# Patient Record
Sex: Female | Born: 1935 | Race: White | Hispanic: No | Marital: Married | Smoking: Never smoker
Health system: Southern US, Community
[De-identification: ages and names within clinical notes are randomized; demographics above are authoritative.]

## PROBLEM LIST (undated history)

## (undated) DIAGNOSIS — L719 Rosacea, unspecified: Secondary | ICD-10-CM

## (undated) DIAGNOSIS — M542 Cervicalgia: Secondary | ICD-10-CM

## (undated) DIAGNOSIS — K449 Diaphragmatic hernia without obstruction or gangrene: Secondary | ICD-10-CM

## (undated) DIAGNOSIS — M810 Age-related osteoporosis without current pathological fracture: Secondary | ICD-10-CM

## (undated) DIAGNOSIS — N6019 Diffuse cystic mastopathy of unspecified breast: Secondary | ICD-10-CM

## (undated) DIAGNOSIS — I341 Nonrheumatic mitral (valve) prolapse: Secondary | ICD-10-CM

## (undated) DIAGNOSIS — J309 Allergic rhinitis, unspecified: Secondary | ICD-10-CM

## (undated) DIAGNOSIS — N200 Calculus of kidney: Secondary | ICD-10-CM

## (undated) DIAGNOSIS — E559 Vitamin D deficiency, unspecified: Secondary | ICD-10-CM

## (undated) DIAGNOSIS — E213 Hyperparathyroidism, unspecified: Secondary | ICD-10-CM

## (undated) DIAGNOSIS — M1990 Unspecified osteoarthritis, unspecified site: Secondary | ICD-10-CM

## (undated) DIAGNOSIS — C3490 Malignant neoplasm of unspecified part of unspecified bronchus or lung: Secondary | ICD-10-CM

## (undated) DIAGNOSIS — R002 Palpitations: Secondary | ICD-10-CM

## (undated) DIAGNOSIS — K224 Dyskinesia of esophagus: Secondary | ICD-10-CM

## (undated) HISTORY — PX: ABDOMINAL HYSTERECTOMY: SHX81

## (undated) HISTORY — DX: Palpitations: R00.2

## (undated) HISTORY — DX: Diaphragmatic hernia without obstruction or gangrene: K44.9

## (undated) HISTORY — PX: OTHER SURGICAL HISTORY: SHX169

## (undated) HISTORY — DX: Calculus of kidney: N20.0

## (undated) HISTORY — PX: OOPHORECTOMY: SHX86

## (undated) HISTORY — DX: Allergic rhinitis, unspecified: J30.9

## (undated) HISTORY — DX: Hypercalcemia: E83.52

## (undated) HISTORY — DX: Vitamin D deficiency, unspecified: E55.9

## (undated) HISTORY — DX: Cervicalgia: M54.2

## (undated) HISTORY — DX: Age-related osteoporosis without current pathological fracture: M81.0

## (undated) HISTORY — DX: Rosacea, unspecified: L71.9

## (undated) HISTORY — DX: Diffuse cystic mastopathy of unspecified breast: N60.19

## (undated) HISTORY — DX: Hyperparathyroidism, unspecified: E21.3

## (undated) HISTORY — DX: Nonrheumatic mitral (valve) prolapse: I34.1

## (undated) HISTORY — DX: Dyskinesia of esophagus: K22.4

## (undated) HISTORY — PX: CHOLECYSTECTOMY: SHX55

## (undated) HISTORY — DX: Unspecified osteoarthritis, unspecified site: M19.90

---

## 2004-07-21 ENCOUNTER — Other Ambulatory Visit: Payer: Self-pay

## 2004-07-21 ENCOUNTER — Emergency Department: Payer: Self-pay | Admitting: Emergency Medicine

## 2006-06-15 ENCOUNTER — Ambulatory Visit: Payer: Self-pay | Admitting: Internal Medicine

## 2007-12-20 ENCOUNTER — Ambulatory Visit: Payer: Self-pay | Admitting: Internal Medicine

## 2012-07-03 ENCOUNTER — Ambulatory Visit: Payer: Self-pay

## 2012-07-11 ENCOUNTER — Ambulatory Visit: Payer: Self-pay | Admitting: Surgery

## 2012-07-17 ENCOUNTER — Ambulatory Visit: Payer: Self-pay | Admitting: Surgery

## 2012-12-19 ENCOUNTER — Ambulatory Visit: Payer: Self-pay | Admitting: Internal Medicine

## 2013-01-01 ENCOUNTER — Ambulatory Visit: Payer: Self-pay | Admitting: Unknown Physician Specialty

## 2013-01-04 ENCOUNTER — Ambulatory Visit: Payer: Self-pay | Admitting: Specialist

## 2013-01-18 ENCOUNTER — Ambulatory Visit: Payer: Self-pay | Admitting: Cardiothoracic Surgery

## 2013-01-25 LAB — CBC CANCER CENTER
Basophil #: 0 "x10 3/mm "
Basophil %: 0.4 %
Eosinophil #: 0 "x10 3/mm "
Eosinophil %: 0.5 %
HCT: 43 %
HGB: 14.5 g/dL
Lymphocyte %: 20.8 %
Lymphs Abs: 1.8 "x10 3/mm "
MCH: 32.5 pg
MCHC: 33.7 g/dL
MCV: 96 fL
Monocyte #: 0.6 "x10 3/mm "
Monocyte %: 6.4 %
Neutrophil #: 6.2 "x10 3/mm "
Neutrophil %: 71.9 %
Platelet: 262 "x10 3/mm "
RBC: 4.46 "x10 6/mm "
RDW: 12.8 %
WBC: 8.6 "x10 3/mm "

## 2013-01-25 LAB — PROTIME-INR
INR: 0.9
Prothrombin Time: 12.7 s

## 2013-01-25 LAB — COMPREHENSIVE METABOLIC PANEL WITH GFR
Albumin: 4.1 g/dL
Alkaline Phosphatase: 90 U/L
Anion Gap: 7
BUN: 14 mg/dL
Bilirubin,Total: 0.5 mg/dL
Calcium, Total: 11 mg/dL — ABNORMAL HIGH
Chloride: 105 mmol/L
Co2: 31 mmol/L
Creatinine: 0.76 mg/dL
EGFR (African American): >60
EGFR (Non-African Amer.): >60
Glucose: 99 mg/dL
Osmolality: 285
Potassium: 4 mmol/L
SGOT(AST): 22 U/L
SGPT (ALT): 34 U/L
Sodium: 143 mmol/L
Total Protein: 7.4 g/dL

## 2013-01-25 LAB — APTT: Activated PTT: 29.3 secs (ref 23.6–35.9)

## 2013-02-12 ENCOUNTER — Ambulatory Visit: Payer: Self-pay

## 2013-02-14 ENCOUNTER — Ambulatory Visit: Payer: Self-pay | Admitting: Cardiothoracic Surgery

## 2013-02-14 ENCOUNTER — Inpatient Hospital Stay: Payer: Self-pay | Admitting: Cardiothoracic Surgery

## 2013-02-14 DIAGNOSIS — C3490 Malignant neoplasm of unspecified part of unspecified bronchus or lung: Secondary | ICD-10-CM

## 2013-02-14 HISTORY — DX: Malignant neoplasm of unspecified part of unspecified bronchus or lung: C34.90

## 2013-02-15 LAB — CBC WITH DIFFERENTIAL/PLATELET
Basophil %: 0.2 %
HGB: 12.9 g/dL (ref 12.0–16.0)
Lymphocyte %: 14.5 %
MCHC: 35 g/dL (ref 32.0–36.0)
MCV: 95 fL (ref 80–100)
Monocyte %: 9.4 %
Neutrophil #: 8.1 10*3/uL — ABNORMAL HIGH (ref 1.4–6.5)
Neutrophil %: 75.9 %
Platelet: 205 10*3/uL (ref 150–440)
RBC: 3.86 10*6/uL (ref 3.80–5.20)
WBC: 10.7 10*3/uL (ref 3.6–11.0)

## 2013-02-15 LAB — BASIC METABOLIC PANEL
BUN: 11 mg/dL (ref 7–18)
Calcium, Total: 9.3 mg/dL (ref 8.5–10.1)
Creatinine: 0.78 mg/dL (ref 0.60–1.30)
EGFR (African American): >60
Glucose: 136 mg/dL — ABNORMAL HIGH (ref 65–99)
Osmolality: 275 (ref 275–301)
Potassium: 3.6 mmol/L (ref 3.5–5.1)

## 2013-02-16 LAB — COMPREHENSIVE METABOLIC PANEL
Alkaline Phosphatase: 70 U/L (ref 50–136)
Bilirubin,Total: 0.3 mg/dL (ref 0.2–1.0)
Calcium, Total: 9.5 mg/dL (ref 8.5–10.1)
Co2: 29 mmol/L (ref 21–32)
EGFR (African American): >60
EGFR (Non-African Amer.): >60
Osmolality: 277 (ref 275–301)
SGOT(AST): 36 U/L (ref 15–37)
SGPT (ALT): 34 U/L (ref 12–78)
Sodium: 138 mmol/L (ref 136–145)

## 2013-02-16 LAB — CBC WITH DIFFERENTIAL/PLATELET
Basophil #: 0 10*3/uL (ref 0.0–0.1)
Basophil %: 0.1 %
Eosinophil #: 0 10*3/uL (ref 0.0–0.7)
HCT: 38 % (ref 35.0–47.0)
HGB: 13.1 g/dL (ref 12.0–16.0)
Lymphocyte #: 0.6 10*3/uL — ABNORMAL LOW (ref 1.0–3.6)
Lymphocyte %: 5.6 %
MCHC: 34.4 g/dL (ref 32.0–36.0)
MCV: 96 fL (ref 80–100)
Monocyte %: 7.1 %
Neutrophil #: 9.6 10*3/uL — ABNORMAL HIGH (ref 1.4–6.5)
RDW: 12.7 % (ref 11.5–14.5)

## 2013-02-17 LAB — CBC WITH DIFFERENTIAL/PLATELET
Basophil #: 0 10*3/uL (ref 0.0–0.1)
Eosinophil %: 1 %
HCT: 35.8 % (ref 35.0–47.0)
HGB: 12.5 g/dL (ref 12.0–16.0)
Lymphocyte #: 1.9 10*3/uL (ref 1.0–3.6)
MCH: 33.4 pg (ref 26.0–34.0)
MCV: 95 fL (ref 80–100)
Monocyte #: 0.7 x10 3/mm (ref 0.2–0.9)
Platelet: 198 10*3/uL (ref 150–440)
RBC: 3.76 10*6/uL — ABNORMAL LOW (ref 3.80–5.20)
RDW: 12.6 % (ref 11.5–14.5)
WBC: 8.8 10*3/uL (ref 3.6–11.0)

## 2013-02-17 LAB — BASIC METABOLIC PANEL
Anion Gap: 4 — ABNORMAL LOW (ref 7–16)
Calcium, Total: 10.2 mg/dL — ABNORMAL HIGH (ref 8.5–10.1)
Chloride: 106 mmol/L (ref 98–107)
Creatinine: 0.58 mg/dL — ABNORMAL LOW (ref 0.60–1.30)
EGFR (African American): >60
EGFR (Non-African Amer.): >60
Glucose: 99 mg/dL (ref 65–99)
Potassium: 3.9 mmol/L (ref 3.5–5.1)
Sodium: 139 mmol/L (ref 136–145)

## 2013-02-21 LAB — PATHOLOGY REPORT

## 2013-03-17 ENCOUNTER — Ambulatory Visit: Payer: Self-pay | Admitting: Cardiothoracic Surgery

## 2013-03-19 ENCOUNTER — Encounter: Payer: Self-pay | Admitting: Cardiothoracic Surgery

## 2013-04-16 ENCOUNTER — Encounter: Payer: Self-pay | Admitting: Cardiothoracic Surgery

## 2013-05-17 ENCOUNTER — Encounter: Payer: Self-pay | Admitting: Cardiothoracic Surgery

## 2013-05-31 ENCOUNTER — Ambulatory Visit: Payer: Self-pay | Admitting: Cardiothoracic Surgery

## 2013-06-17 ENCOUNTER — Ambulatory Visit: Payer: Self-pay | Admitting: Cardiothoracic Surgery

## 2013-07-15 ENCOUNTER — Ambulatory Visit: Payer: Self-pay | Admitting: Cardiothoracic Surgery

## 2013-07-15 ENCOUNTER — Ambulatory Visit: Payer: Self-pay | Admitting: Oncology

## 2013-08-15 ENCOUNTER — Ambulatory Visit: Payer: Self-pay | Admitting: Cardiothoracic Surgery

## 2013-08-15 ENCOUNTER — Ambulatory Visit: Payer: Self-pay | Admitting: Oncology

## 2014-01-15 ENCOUNTER — Ambulatory Visit: Payer: Self-pay | Admitting: Oncology

## 2014-01-17 ENCOUNTER — Ambulatory Visit: Payer: Self-pay | Admitting: Oncology

## 2014-02-14 ENCOUNTER — Ambulatory Visit: Payer: Self-pay | Admitting: Oncology

## 2014-07-18 ENCOUNTER — Ambulatory Visit: Payer: Self-pay | Admitting: Oncology

## 2014-07-23 ENCOUNTER — Ambulatory Visit
Admit: 2014-07-23 | Disposition: A | Discharge status: Home / Self Care | Payer: Self-pay | Attending: Oncology | Admitting: Oncology

## 2014-08-16 ENCOUNTER — Ambulatory Visit
Admit: 2014-08-16 | Disposition: A | Discharge status: Home / Self Care | Payer: Self-pay | Attending: Oncology | Admitting: Oncology

## 2014-08-30 ENCOUNTER — Other Ambulatory Visit: Payer: Self-pay | Admitting: Oncology

## 2014-08-30 DIAGNOSIS — D1430 Benign neoplasm of unspecified bronchus and lung: Secondary | ICD-10-CM

## 2014-09-06 NOTE — Op Note (Signed)
PATIENT NAME:  Virginia Silva, Virginia Silva MR#:  [id number] DATE OF BIRTH:  1935/09/16  DATE OF PROCEDURE:  02/14/2013  SURGEON: Nestor Lewandowsky, M.D.   ASSISTANT: Bronson Ing, Silva.   PREOPERATIVE DIAGNOSIS: Right middle lobe mass.   POSTOPERATIVE DIAGNOSIS: Right middle lobe mass.  OPERATION PERFORMED:   1.  Preoperative bronchoscopy to assess endobronchial anatomy.  2.  Right thoracotomy with right middle lobectomy.   INDICATIONS FOR PROCEDURE: Virginia Silva is a 79 year old woman, who recently was found to have a right middle lobe mass on x-ray. She was found to be a suitable candidate for surgery. She was offered the above-named procedure for definitive diagnosis and treatment.   DESCRIPTION OF PROCEDURE: The patient was brought to the operating suite and placed in the supine position. General endotracheal anesthesia was given with a double-lumen tube. Preoperative bronchoscopy was carried out. There was no evidence of endobronchial tumor. The patient was then turned for right thoracoscopy. All pressure points were carefully padded. The patient was prepped and draped in the usual sterile fashion. We began by making a single thoracoscopic port to accommodate a 15 mm trocar in the lower aspect of the chest. We then looked at the middle lobe through the camera and we could see that the tumor was in the central portion on the inferior aspect of the middle lobe, which made it quite difficult to deal with it thoracoscopically and as a wedge resection. We, therefore, extended our more anterior port to the tip of the scapula and went through the fifth interspace. Once we entered the chest, the middle lobe mass was easily palpable. It had a very fleshy consistency to it similar to what might be seen with a carcinoid tumor. It did not appear to be a hamartoma. The fissure between the upper lobe and lower lobes were created. There were 2 arterial branches and 2 venous branches to the middle lobe. We freed up the inferior  pulmonary ligament and identified the inferior pulmonary vein. We then dissected out the bronchus.   We began by dividing the arterial branches and then the venous branches. This was accomplished with a vascular stapler. We then took the middle lobe bronchus. We ventilated the upper and lower lobes before dividing the middle lobe bronchus. Once this was complete, the only remaining structure was some lung parenchyma, which was attaching the middle and the lower lobes. This was resected with the endoscopic stapler. Again, the middle and lower lobes ventilated quite nicely. We then used Progel on the cut surfaces of the lung after checking the bronchus for an air leak. No air leak was identified at 30 cm of water pressure. There was some air leaking from some of the cut surfaces of the lung parenchyma. The chest was drained with two 28-French chest tubes. A straight tube was positioned to the apex and an angled tube positioned along the paravertebral space. There were several small lymph nodes that were removed and labeled appropriately and sent for permanent sections. No other pathology was identified and the chest was then closed. Vicryl #2 pericostal sutures were used to reapproximate the ribs. The port site was closed with multiple interrupted sutures including nylon on the skin. The thoracotomy wound was closed by allowing the serratus anterior muscle to return to its normal anatomic position. It was not divided during the procedure. The latissimus muscle was closed with #2 Vicryl, the subcutaneous tissues with 2-0 Vicryl and the skin with skin clips. The patient tolerated the procedure well, was extubated  and taken to the recovery room in stable condition.  ____________________________ Virginia Dawes Virginia Bi, Silva teo:aw D: 02/14/2013 13:20:31 ET T: 02/14/2013 13:51:48 ET JOB#: [id number]  cc: Virginia Reading E. Virginia Bi, Silva, <Dictator> Virginia Silva Virginia Silva. Virginia Hutching, Silva Virginia Silva ELECTRONICALLY SIGNED  02/16/2013 8:00

## 2014-09-06 NOTE — Discharge Summary (Signed)
PATIENT NAME:  Virginia Silva, Virginia Silva MR#:  [id number] DATE OF BIRTH:  12/10/1935  DATE OF ADMISSION:  02/14/2013 DATE OF DISCHARGE:  02/19/2013  ADMITTING DIAGNOSIS: Right middle lobe mass.   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   HOSPITAL COURSE: Ms. Tersa Fotopoulos is a 79 year old woman who was recently found to have a right middle lobe nodule. She was found to be a suitable candidate for surgical resection and after discussion with the patient regarding the options she elected to proceed. She was brought in to the hospital on 02/14/2013 where she underwent a right thoracotomy and right middle lobectomy. Her postoperative course was essentially unremarkable. Her chest tubes were removed on 02/19/2013 and a chest x-ray post chest tube removal showed the lung to be fully expanded.   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 At the time of her hospital discharge her thoracotomy wound is healing as expected. She was given a prescription for Norco 5/325, 1 to 2 tablets every four hours as needed. She was also told to restart her aspirin, vitamin D and biotin. She will follow-up with me in one week. She will also see one of our oncologist for further recommendations regarding her lung tumor.    ____________________________ Lew Dawes. Genevive Bi, MD teo:sg D: 02/28/2013 13:03:40 ET T: 02/28/2013 13:32:10 ET JOB#: [id number]  cc: Christia Reading E. Genevive Bi, MD, <Dictator> Leonie Douglas. Doy Hutching, MD  Louis Matte MD ELECTRONICALLY SIGNED 02/28/2013 15:11

## 2014-09-06 NOTE — Consult Note (Signed)
[name], Virginia Silva MR#:  [id number] DATE OF BIRTH:  02-Dec-1935  DATE OF CONSULTATION:  02/14/2013  REFERRING PHYSICIAN: Nestor Lewandowsky, MD CONSULTING PHYSICIAN:  Williette Loewe A. Posey Pronto, MD  PRIMARY CARE PHYSICIAN: Fulton Reek, MD.   REASON FOR CONSULTATION: Medical management postoperative thoracotomy.   HISTORY OF PRESENT ILLNESS: Virginia Silva is a very pleasant 79 year old Caucasian female with no significant past medical history other than undergoing cholecystectomy in March of this year for acute cholecystitis. Virginia Silva was admitted by Dr. Genevive Bi to [facility] after Virginia Silva underwent an elective right thoracotomy with right middle lobectomy secondary to right middle lobe mass that was found incidentally on chest x-ray and evaluation per PET scan shows it was indeterminate for malignancy. Virginia Silva, after discussion with Dr. Genevive Bi, decided to go for surgical approach. Virginia Silva just got extubated and is hemodynamically stable. Virginia Silva sats are 96% on room air. Virginia Silva blood pressure is stabile well. Virginia Silva is tolerating ice chips at this time. Virginia Silva has some back pain secondary to positioning. Virginia Silva has Virginia IV pain medication through Virginia Silva epidural anesthesia. Virginia Silva, at present, does not have any complaints of chest pain, shortness of breath, abdominal pain, nausea or vomiting.   PAST MEDICAL HISTORY: History of mitral valve prolapse.  PAST SURGICAL HISTORY: Cholecystectomy in March 2014 for acute cholecystitis and hysterectomy in 1985.  FAMILY HISTORY: Brother died of cholangiocarcinoma. Mother deceased at age of 23 from uremia. Virginia Silva also had benign brain tumor. Father died at age 67 from heart failure.   SOCIAL HISTORY: Virginia Silva is married. 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 Virginia Silva does not drink or smoke.   HOME MEDICATIONS:  1.  Aspirin 81 mg daily.  2.  Vitamin D3 1000 international units 1 tablet b.i.d.  3.  Biotin 1000 mcg p.o. daily.   ALLERGIES: BACTRIM, TAPE AND ILLOSONE.   REVIEW OF SYSTEMS:  CONSTITUTIONAL: No fever, fatigue, weakness. EYES: No blurred or double vision, glaucoma or cataracts. ENT: No tinnitus, ear pain, hearing loss. RESPIRATORY: No cough, wheeze, hemoptysis. CARDIOVASCULAR: No chest pain, orthopnea, edema, palpitations. GASTROINTESTINAL: No nausea, vomiting, diarrhea, abdominal pain or GERD. GENITOURINARY: No dysuria or hematuria or frequency. ENDOCRINE: No polyuria, nocturia or thyroid problems. HEMATOLOGY: No anemia or easy bruising or bleeding. ENDOCRINE: No polyuria, nocturia or thyroid problems. HEMATOLOGY: No anemia or easy bruising or bleeding disorder. SKIN: No acne or rash.  MUSCULOSKELETAL: Positive for back pain. No arthritis, swelling or gout. NEUROLOGIC: No CVA, transient ischemic attack, dysarthria or ataxia or vertigo. PSYCHIATRIC: Virginia Silva has no anxiety, depression or bipolar disorder. All other systems reviewed and negative.   Virginia Silva's preop EKG done on 09/11, normal sinus rhythm. Virginia Silva PT-INR was 12.7 and 0.9. Virginia Silva comprehensive metabolic panel was within normal limits, except calcium of 11.0. CBC preop 09/11 was within normal limits.   PHYSICAL EXAMINATION:  GENERAL: Virginia Silva is awake, alert, oriented x 3, not in acute distress.  VITAL SIGNS: Virginia Silva is afebrile, pulse is 76, blood pressure 129/47, sats are 94% on room air.  HEENT: Atraumatic, normocephalic. Pupils: PERRLA. EOMI. Oral mucosa is moist.  NECK: Supple. No JVD. No carotid bruit.  LUNGS: Clear to auscultation bilaterally. No rales, rhonchi, respiratory distress or labored breathing. Virginia Silva has a right-sided chest tube placement.  CARDIOVASCULAR: Both heart sounds are normal. Rate and rhythm is regular. PMI not lateralized. Chest nontender. Good pedal pulses. Good femoral pulses. No lower extremity edema.  ABDOMEN: Soft, benign, nontender. No organomegaly. Positive bowel sounds.  NEUROLOGIC: Grossly  intact cranial nerves II through XII. No motor or sensory deficit.  PSYCHIATRIC: Virginia  Silva is awake, alert, oriented x 3.  SKIN: Warm and dry.   ASSESSMENT AND PLAN: A 79 year old, Virginia Silva with history of mitral valve prolapse, who was admitted and underwent elective right-sided thoracotomy with right middle lobe mass removal and is now admitted in [facility]. Internal medicine was consulted for:  1.  Postop medical management. Virginia Silva does not have any chronic medical problems other than history of mitral valve prolapse. Virginia Silva is hemodynamically stable, including a blood pressure and sats are more than 96%. Will resume Virginia Silva aspirin when okay with surgery. We will also resume Virginia Silva oral vitamin D3 and Biotin capsules once Virginia Silva is able to tolerate p.o.  2.  Status post right side thoracotomy with right middle lobe mass resection. Pathology results are pending. Virginia Silva is extubated postoperatively, seen in [facility]. Sats are 96% on room air. Virginia Silva is stable at this time. Virginia Silva has a right-sided chest tube present. Daily x-rays will be followed by Dr. Genevive Bi.  3.  Deep venous thrombosis prophylaxis. Virginia Silva has SCDs and TEDs.  4.  History of mitral valve prolapse, stable.  5.  Recommend incentive spirometer, which will be started soon.  6.  Further work-up according to Virginia Silva's clinical course.   Thank you for Virginia consult. We will follow Virginia Silva while [facility] Silva is in [facility].   TIME SPENT: 50 minutes.  ____________________________ Hart Rochester Posey Pronto, MD sap:aw D: 02/14/2013 15:12:41 ET T: 02/14/2013 15:26:25 ET JOB#: [id number]  cc: Jenipher Havel A. Posey Pronto, MD, <Dictator> Timothy E. Genevive Bi, MD Leonie Douglas. Doy Hutching, MD Ilda Basset MD ELECTRONICALLY SIGNED 02/14/2013 18:06

## 2014-09-06 NOTE — Op Note (Signed)
PATIENT NAME:  Virginia Silva, Virginia Silva MR#:  [id number] DATE OF BIRTH:  Sep 18, 1935  DATE OF PROCEDURE:  07/17/2012  PREOPERATIVE DIAGNOSIS: Cholecystitis, cholelithiasis.   POSTOPERATIVE DIAGNOSIS: Acute cholecystitis, cholelithiasis.   PROCEDURE: Laparoscopic cholecystectomy.   SURGEON: Rochel Brome, MD   ANESTHESIA: General.   INDICATION: This 79 year old female recently had an episode of epigastric pain, ultrasound findings of gallstones with a thickened gallbladder wall. Surgery was recommended for definitive treatment.   DESCRIPTION OF PROCEDURE: The patient was placed on the operating table in the supine position under general endotracheal anesthesia. The abdomen was prepared with ChloraPrep and draped in a sterile manner. A short incision was made in the inferior aspect of the umbilicus and carried down to the deep fascia which was grasped with laryngeal hook and elevated. A Veress needle was inserted, aspirated, and irrigated with a saline solution. Next, the peritoneal cavity was inflated with carbon dioxide. The Veress needle was removed. The 10 mm cannula was inserted. The 10 mm, 0-degree laparoscope was inserted to view the peritoneal cavity. Another incision was made in the epigastrium just to the right of midline to insert an 11 mm cannula. Two incisions were made in the lateral aspect of the right upper quadrant to introduce two 5-mm cannulas.   The gallbladder was found to be acutely distended and had a thickened wall. There were a number of adhesions which were taken down with blunt and sharp dissection and the use of hook and cautery. The gallbladder was decompressed with a lancing needle draining clear colorless bile.  The gallbladder was then retracted towards the right shoulder. Additional adhesions were taken down with blunt and sharp dissection. The neck of the gallbladder was grasped. Dissection was carried out to isolate the cystic artery from surrounding structures, and also the  cystic duct was identified and was dissected free from surrounding structures. The neck of the gallbladder was mobilized with incision of the visceral peritoneum. A critical view of safety was demonstrated. The cystic artery was divided between double Endo Clips to allow better exposure of the cystic duct. An Endo Clip was placed across the cystic duct adjacent to the neck of the gallbladder. An incision was made in the cystic duct to introduce a Reddick catheter, however, the Reddick catheter would not thread in and, therefore, a cholangiogram was not done. The Reddick catheter was removed. The cystic duct was doubly ligated with Endo Clips and divided. The gallbladder was dissected free from the liver with hook and cautery. Hemostasis was subsequently intact. The gallbladder was delivered up through the infraumbilical incision, opened and suctioned. It was necessary to enlarge the incision. There were multiple large stones. The incision was then lengthened several times, ultimately to some 3 cm in length, and I removed the gallbladder with the large stones and submitted it in formalin for routine pathology. The right upper quadrant was further inspected, irrigated, and aspirated. Hemostasis appeared to be intact. The remaining cannulas were removed. Carbon dioxide was allowed to escape from the peritoneal cavity. Next, the fascial defect at the umbilicus was closed with interrupted 0 Vicryl figure-of-eight sutures. All incisions were closed with interrupted 5-0 chromic subcuticular sutures, benzoin, and Steri-Strips. Dressings were applied with paper tape. The patient tolerated surgery satisfactorily and was then prepared for transfer to the recovery room.   ____________________________ Lenna Sciara. Rochel Brome, MD jws:cb D: 07/17/2012 10:33:07 ET T: 07/17/2012 11:14:11 ET JOB#: [id number]  cc: Loreli Dollar, MD, <Dictator> Loreli Dollar MD ELECTRONICALLY SIGNED 07/17/2012 15:21

## 2014-12-26 ENCOUNTER — Other Ambulatory Visit: Payer: Self-pay | Admitting: Internal Medicine

## 2014-12-26 DIAGNOSIS — M79605 Pain in left leg: Secondary | ICD-10-CM

## 2014-12-30 ENCOUNTER — Ambulatory Visit: Payer: Medicare Other

## 2015-01-07 ENCOUNTER — Ambulatory Visit
Admission: RE | Admit: 2015-01-07 | Discharge: 2015-01-07 | Disposition: A | Discharge status: Home / Self Care | Payer: Medicare Other | Source: Ambulatory Visit | Attending: Internal Medicine | Admitting: Internal Medicine

## 2015-01-07 DIAGNOSIS — M79605 Pain in left leg: Secondary | ICD-10-CM | POA: Diagnosis not present

## 2015-03-04 ENCOUNTER — Other Ambulatory Visit: Payer: Self-pay | Admitting: Internal Medicine

## 2015-03-04 DIAGNOSIS — E210 Primary hyperparathyroidism: Secondary | ICD-10-CM

## 2015-03-04 DIAGNOSIS — M810 Age-related osteoporosis without current pathological fracture: Secondary | ICD-10-CM

## 2015-03-10 ENCOUNTER — Ambulatory Visit: Payer: Medicare (Managed Care)

## 2015-06-30 DIAGNOSIS — E559 Vitamin D deficiency, unspecified: Secondary | ICD-10-CM | POA: Insufficient documentation

## 2015-06-30 DIAGNOSIS — E213 Hyperparathyroidism, unspecified: Secondary | ICD-10-CM | POA: Insufficient documentation

## 2015-07-09 HISTORY — PX: PARATHYROIDECTOMY: SHX19

## 2015-07-23 ENCOUNTER — Other Ambulatory Visit: Payer: Self-pay | Admitting: Oncology

## 2015-07-23 DIAGNOSIS — C7A00 Malignant carcinoid tumor of unspecified site: Secondary | ICD-10-CM

## 2015-07-24 ENCOUNTER — Ambulatory Visit
Admission: RE | Admit: 2015-07-24 | Discharge: 2015-07-24 | Disposition: A | Discharge status: Home / Self Care | Payer: Medicare Other | Source: Ambulatory Visit | Attending: Oncology | Admitting: Oncology

## 2015-07-24 ENCOUNTER — Inpatient Hospital Stay: Payer: Medicare Other | Attending: Oncology

## 2015-07-24 DIAGNOSIS — I2510 Atherosclerotic heart disease of native coronary artery without angina pectoris: Secondary | ICD-10-CM | POA: Diagnosis not present

## 2015-07-24 DIAGNOSIS — Z79899 Other long term (current) drug therapy: Secondary | ICD-10-CM | POA: Insufficient documentation

## 2015-07-24 DIAGNOSIS — C7A00 Malignant carcinoid tumor of unspecified site: Secondary | ICD-10-CM | POA: Insufficient documentation

## 2015-07-24 DIAGNOSIS — Z902 Acquired absence of lung [part of]: Secondary | ICD-10-CM | POA: Insufficient documentation

## 2015-07-24 DIAGNOSIS — M810 Age-related osteoporosis without current pathological fracture: Secondary | ICD-10-CM | POA: Insufficient documentation

## 2015-07-24 DIAGNOSIS — D1430 Benign neoplasm of unspecified bronchus and lung: Secondary | ICD-10-CM

## 2015-07-24 DIAGNOSIS — Z7982 Long term (current) use of aspirin: Secondary | ICD-10-CM | POA: Insufficient documentation

## 2015-07-24 DIAGNOSIS — E559 Vitamin D deficiency, unspecified: Secondary | ICD-10-CM | POA: Insufficient documentation

## 2015-07-24 DIAGNOSIS — E213 Hyperparathyroidism, unspecified: Secondary | ICD-10-CM | POA: Insufficient documentation

## 2015-07-24 DIAGNOSIS — J45909 Unspecified asthma, uncomplicated: Secondary | ICD-10-CM | POA: Insufficient documentation

## 2015-07-24 DIAGNOSIS — Z85110 Personal history of malignant carcinoid tumor of bronchus and lung: Secondary | ICD-10-CM | POA: Insufficient documentation

## 2015-07-24 DIAGNOSIS — I341 Nonrheumatic mitral (valve) prolapse: Secondary | ICD-10-CM | POA: Insufficient documentation

## 2015-07-24 HISTORY — DX: Malignant neoplasm of unspecified part of unspecified bronchus or lung: C34.90

## 2015-07-24 LAB — POCT I-STAT CREATININE: Creatinine, Ser: 0.7 mg/dL (ref 0.44–1.00)

## 2015-07-24 MED ORDER — IOHEXOL 300 MG/ML  SOLN
75.0000 mL | Freq: Once | INTRAMUSCULAR | Status: AC | PRN
  Administered 2015-07-24: 75 mL via INTRAVENOUS

## 2015-07-28 ENCOUNTER — Inpatient Hospital Stay (HOSPITAL_BASED_OUTPATIENT_CLINIC_OR_DEPARTMENT_OTHER): Payer: Medicare Other | Admitting: Oncology

## 2015-07-28 ENCOUNTER — Encounter: Payer: Self-pay | Admitting: Oncology

## 2015-07-28 VITALS — BP 132/76 | HR 72 | Temp 98.7°F | Resp 16 | Wt 145.9 lb

## 2015-07-28 DIAGNOSIS — Z902 Acquired absence of lung [part of]: Secondary | ICD-10-CM | POA: Diagnosis not present

## 2015-07-28 DIAGNOSIS — I2510 Atherosclerotic heart disease of native coronary artery without angina pectoris: Secondary | ICD-10-CM | POA: Diagnosis not present

## 2015-07-28 DIAGNOSIS — J45909 Unspecified asthma, uncomplicated: Secondary | ICD-10-CM

## 2015-07-28 DIAGNOSIS — Z85110 Personal history of malignant carcinoid tumor of bronchus and lung: Secondary | ICD-10-CM

## 2015-07-28 DIAGNOSIS — M810 Age-related osteoporosis without current pathological fracture: Secondary | ICD-10-CM

## 2015-07-28 DIAGNOSIS — Z7982 Long term (current) use of aspirin: Secondary | ICD-10-CM | POA: Diagnosis not present

## 2015-07-28 DIAGNOSIS — E213 Hyperparathyroidism, unspecified: Secondary | ICD-10-CM

## 2015-07-28 DIAGNOSIS — Z79899 Other long term (current) drug therapy: Secondary | ICD-10-CM

## 2015-07-28 DIAGNOSIS — D1430 Benign neoplasm of unspecified bronchus and lung: Secondary | ICD-10-CM

## 2015-07-28 DIAGNOSIS — E559 Vitamin D deficiency, unspecified: Secondary | ICD-10-CM

## 2015-07-28 DIAGNOSIS — I341 Nonrheumatic mitral (valve) prolapse: Secondary | ICD-10-CM | POA: Diagnosis not present

## 2015-07-28 NOTE — Progress Notes (Signed)
Here to discuss CT results and does not offer any problems today.

## 2015-08-03 NOTE — Progress Notes (Signed)
[facility]  [phone] [phone]  ID: Virginia Silva OB: [birth year]  MR#: [id number]  FBP#:[id number]  Patient Care Team: Idelle Crouch, MD as PCP - General (Internal Medicine)  CHIEF COMPLAINT: Carcinoid-typical, stage I  INTERVAL HISTORY: Patient returns to clinic today for further evaluation and discussion of [name].  [name] and remains asymptomatic.  She has no neurologic complaints.  She has a good appetite and denies weight loss.  She has no recent fevers.  She denies any shortness of breath, cough, or hemoptysis.  She has no nausea, vomiting, constipation, or diarrhea.  She has no urinary complaints.  Patient offers no specific complaints today.  REVIEW OF SYSTEMS:   Review of Systems  Constitutional: Negative.  Negative for fever and malaise/fatigue.  Respiratory: Negative.  Negative for cough, hemoptysis and shortness of breath.   Cardiovascular: Negative.  Negative for chest pain.  Gastrointestinal: Negative.   Genitourinary: Negative.   Musculoskeletal: Negative.   Neurological: Negative for weakness.    As per HPI. Otherwise, a complete review of systems is negatve.  PAST MEDICAL HISTORY: Past Medical History  Diagnosis Date  . Lung cancer ([facility]) 02/14/2013    Partial RML Lung Resection.  . Hyperparathyroidism ([facility])   . Hypercalcemia   . Vitamin D deficiency   . Senile osteoporosis   . Osteoarthritis   . Asthma   . Allergic rhinitis due to allergen   . Palpitations   . Hiatal hernia   . Rosacea   . Fibrocystic breast disease   . Mitral valvular prolapse   . Cervicalgia   . Esophageal spasm     PAST SURGICAL HISTORY: Past Surgical History  Procedure Laterality Date  . Oophorectomy    . Cholecystectomy    . Thoracoscopy with wedge resection lung    . Parathyroidectomy  07/09/2015  . Abdominal hysterectomy      FAMILY HISTORY No family history on file.     ADVANCED  DIRECTIVES:    HEALTH MAINTENANCE: Social History  Substance Use Topics  . Smoking status: Never Smoker   . Smokeless tobacco: Never Used  . Alcohol Use: No     Colonoscopy:  PAP:  Bone density:  Lipid panel:  Allergies  Allergen Reactions  . Chloraprep One Step [Chlorhexidine Gluconate] Rash  . Erythromycin Rash  . Sulfa Antibiotics Rash    Current Outpatient Prescriptions  Medication Sig Dispense Refill  . aspirin EC 81 MG tablet Take by mouth.    . calcium carbonate (TUMS EX) 750 MG chewable tablet Chew by mouth.    . triamcinolone cream (KENALOG) 0.1 %     . Cholecalciferol (VITAMIN D) 2000 units tablet Take by mouth.    . ondansetron (ZOFRAN-ODT) 4 MG disintegrating tablet      No current facility-administered medications for this visit.    OBJECTIVE: Filed Vitals:   07/28/15 1118  BP: 132/76  Pulse: 72  Temp: 98.7 F (37.1 C)  Resp: 16     There is no height on file to calculate BMI.    ECOG FS:0 - Asymptomatic  General: Well-developed, well-nourished, no acute distress. Eyes: Pink conjunctiva, anicteric sclera. Lungs: Clear to auscultation bilaterally. Heart: Regular rate and rhythm. No rubs, murmurs, or gallops. Abdomen: Soft, nontender, nondistended. No organomegaly noted, normoactive bowel sounds. Musculoskeletal: No edema, cyanosis, or clubbing. Neuro: Alert, answering all questions appropriately. Cranial nerves grossly intact. Skin: No rashes or petechiae noted. Psych: Normal affect.   LAB  RESULTS:  Lab Results  Component Value Date   NA 139 02/17/2013   K 3.9 02/17/2013   CL 106 02/17/2013   CO2 29 02/17/2013   GLUCOSE 99 02/17/2013   BUN 9 02/17/2013   CREATININE 0.70 07/24/2015   CALCIUM 10.2* 02/17/2013   PROT 6.1* 02/16/2013   ALBUMIN 3.0* 02/16/2013   AST 36 02/16/2013   ALT 34 02/16/2013   ALKPHOS 70 02/16/2013   BILITOT 0.3 02/16/2013   GFRNONAA >60 02/17/2013   GFRAA >60 02/17/2013    Lab Results  Component Value Date     WBC 8.8 02/17/2013   NEUTROABS 6.1 02/17/2013   HGB 12.5 02/17/2013   HCT 35.8 02/17/2013   MCV 95 02/17/2013   PLT 198 02/17/2013     STUDIES: Ct Chest W Contrast  07/24/2015  CLINICAL DATA:  Right middle lobe resection 02/14/2013. History lung cancer. Carcinoid primary. Restaging. EXAM: CT CHEST WITH CONTRAST TECHNIQUE: Multidetector CT imaging of the chest was performed during intravenous contrast administration. CONTRAST:  60m OMNIPAQUE IOHEXOL 300 MG/ML  SOLN COMPARISON:  07/18/2014 FINDINGS: Mediastinum/Nodes: Enlargement, heterogeneity of the thyroid, worse on the left. Aortic and branch vessel atherosclerosis. Mild cardiomegaly, without pericardial effusion. Lad coronary artery atherosclerosis. No mediastinal or hilar adenopathy. Lungs/Pleura: No pleural fluid. Minimal right-sided pleural thickening is similar. Status post right middle lobectomy. Upper abdomen: Vague hepatic dome hyper enhancement measures 6 mm on image 46/series 2 and was present on the prior exam. Favored to represent a perfusion anomaly. Similar mild atrophy involving the lateral segment left liver lobe. Cholecystectomy. Normal imaged portions of the spleen, stomach, pancreas, right adrenal gland. Mild left adrenal nodularity is similar. Separate origins of the splenic and common hepatic arteries. Musculoskeletal: Postsurgical defect involving anterior lateral fifth and sixth right ribs. IMPRESSION: 1. Status post right middle lobectomy, without recurrent or metastatic disease. 2.  Atherosclerosis, including within the coronary arteries. 3. 6 mm focus of right hepatic lobe hyper enhancement is similar and favored to represent a perfusion anomaly. Especially given the history of hypervascular primary, recommend attention on follow-up. Electronically Signed   By: KAbigail MiyamotoM.D.   On: 07/24/2015 13:51    ASSESSMENT: Stage I typical carcinoid.  PLAN:    1.  Carcinoid: CT results reviewed independently and reported no  evidence of disease.  No treatment is necessary.  Patient does not have any symptoms of carcinoid syndrome, therefore octreotide would be of no help.  There is no data to suggest that adjuvant octreotide without carcinoid syndrome would be useful.  Patient had her surgery on February 14, 2013. She has requested less frequent follow-up, therefore will reimage yearly until she is 5 years removed from surgery. Return to clinic in 1 year for further evaluation.   Patient expressed understanding and was in agreement with this plan. She also understands that She can call clinic at any time with any questions, concerns, or complaints.    TLloyd Huger MD   08/03/2015 9:35 AM

## 2016-07-23 ENCOUNTER — Ambulatory Visit: Payer: Medicare Other

## 2016-07-23 ENCOUNTER — Ambulatory Visit
Admission: RE | Admit: 2016-07-23 | Discharge: 2016-07-23 | Disposition: A | Discharge status: Home / Self Care | Payer: Medicare Other | Source: Ambulatory Visit | Attending: Oncology | Admitting: Oncology

## 2016-07-23 DIAGNOSIS — E010 Iodine-deficiency related diffuse (endemic) goiter: Secondary | ICD-10-CM | POA: Insufficient documentation

## 2016-07-23 DIAGNOSIS — D3A090 Benign carcinoid tumor of the bronchus and lung: Secondary | ICD-10-CM | POA: Diagnosis present

## 2016-07-23 LAB — POCT I-STAT CREATININE: Creatinine, Ser: 0.7 mg/dL (ref 0.44–1.00)

## 2016-07-23 MED ORDER — IOPAMIDOL (ISOVUE-300) INJECTION 61%
75.0000 mL | Freq: Once | INTRAVENOUS | Status: AC | PRN
  Administered 2016-07-23: 75 mL via INTRAVENOUS

## 2016-07-25 DIAGNOSIS — D3A00 Benign carcinoid tumor of unspecified site: Secondary | ICD-10-CM | POA: Insufficient documentation

## 2016-07-25 NOTE — Progress Notes (Signed)
[place] [place]  [phone] [phone]  ID: Virginia Silva OB: [birth year]  MR#: [id number]  ZSW#:[id number]  Patient Care Team: Idelle Crouch, MD as PCP - General (Internal Medicine)  CHIEF COMPLAINT: Carcinoid-typical, stage I  INTERVAL HISTORY: Patient returns to clinic today for further evaluation and discussion of [name].  [name] and remains asymptomatic.  She has no neurologic complaints.  She has a good appetite and denies weight loss.  She has no recent fevers.  She denies any shortness of breath, cough, or hemoptysis.  She has no nausea, vomiting, constipation, or diarrhea.  She has no urinary complaints.  Patient offers no specific complaints today.  REVIEW OF SYSTEMS:   Review of Systems  Constitutional: Negative.  Negative for fever, malaise/fatigue and weight loss.  Respiratory: Negative.  Negative for cough, hemoptysis and shortness of breath.   Cardiovascular: Negative.  Negative for chest pain and leg swelling.  Gastrointestinal: Negative.  Negative for abdominal pain and diarrhea.  Genitourinary: Negative.   Musculoskeletal: Negative.   Neurological: Negative.  Negative for sensory change and weakness.  Psychiatric/Behavioral: Negative.  The patient is not nervous/anxious.     As per HPI. Otherwise, a complete review of systems is negative.  PAST MEDICAL HISTORY: Past Medical History:  Diagnosis Date  . Allergic rhinitis due to allergen   . Asthma   . Cervicalgia   . Esophageal spasm   . Fibrocystic breast disease   . Hiatal hernia   . Hypercalcemia   . Hyperparathyroidism ([place])   . Lung cancer ([place]) 02/14/2013   Partial RML Lung Resection.  . Mitral valvular prolapse   . Osteoarthritis   . Palpitations   . Rosacea   . Senile osteoporosis   . Vitamin D deficiency     PAST SURGICAL HISTORY: Past Surgical History:  Procedure Laterality Date  . ABDOMINAL HYSTERECTOMY    .  CHOLECYSTECTOMY    . OOPHORECTOMY    . PARATHYROIDECTOMY  07/09/2015  . THORACOSCOPY WITH WEDGE RESECTION LUNG      FAMILY HISTORY No family history on file.     ADVANCED DIRECTIVES:    HEALTH MAINTENANCE: Social History  Substance Use Topics  . Smoking status: Never Smoker  . Smokeless tobacco: Never Used  . Alcohol use No     Colonoscopy:  PAP:  Bone density:  Lipid panel:  Allergies  Allergen Reactions  . Chloraprep One Step [Chlorhexidine Gluconate] Rash  . Erythromycin Rash  . Sulfa Antibiotics Rash    Current Outpatient Prescriptions  Medication Sig Dispense Refill  . aspirin EC 81 MG tablet Take by mouth.    . calcium carbonate (TUMS EX) 750 MG chewable tablet Chew by mouth.    . Cholecalciferol (VITAMIN D) 2000 units tablet Take by mouth.    . ondansetron (ZOFRAN-ODT) 4 MG disintegrating tablet     . triamcinolone cream (KENALOG) 0.1 %      No current facility-administered medications for this visit.     OBJECTIVE: Vitals:   07/27/16 1100  BP: 127/78  Pulse: 73  Resp: 18  Temp: 98.1 F (36.7 C)     There is no height or weight on file to calculate BMI.    ECOG FS:0 - Asymptomatic  General: Well-developed, well-nourished, no acute distress. Eyes: Pink conjunctiva, anicteric sclera. Lungs: Clear to auscultation bilaterally. Heart: Regular rate and rhythm. No rubs, murmurs, or gallops. Abdomen: Soft, nontender, nondistended. No organomegaly noted, normoactive bowel sounds. Musculoskeletal: No  edema, cyanosis, or clubbing. Neuro: Alert, answering all questions appropriately. Cranial nerves grossly intact. Skin: No rashes or petechiae noted. Psych: Normal affect.   LAB RESULTS:  Lab Results  Component Value Date   NA 139 02/17/2013   K 3.9 02/17/2013   CL 106 02/17/2013   CO2 29 02/17/2013   GLUCOSE 99 02/17/2013   BUN 9 02/17/2013   CREATININE 0.70 07/23/2016   CALCIUM 10.2 (H) 02/17/2013   PROT 6.1 (L) 02/16/2013   ALBUMIN 3.0 (L)  02/16/2013   AST 36 02/16/2013   ALT 34 02/16/2013   ALKPHOS 70 02/16/2013   BILITOT 0.3 02/16/2013   GFRNONAA >60 02/17/2013   GFRAA >60 02/17/2013    Lab Results  Component Value Date   WBC 8.8 02/17/2013   NEUTROABS 6.1 02/17/2013   HGB 12.5 02/17/2013   HCT 35.8 02/17/2013   MCV 95 02/17/2013   PLT 198 02/17/2013     STUDIES: Ct Chest W Contrast  Result Date: 07/23/2016 CLINICAL DATA:  Right carcinoid tumor of the lung. EXAM: CT CHEST WITH CONTRAST TECHNIQUE: Multidetector CT imaging of the chest was performed during intravenous contrast administration. CONTRAST:  6m ISOVUE-300 IOPAMIDOL (ISOVUE-300) INJECTION 61% COMPARISON:  07/24/2015 FINDINGS: Cardiovascular: The heart size is normal. No pericardial effusion. Coronary artery calcification is noted. Mediastinum/Nodes: Thyroid gland is enlarged and heterogeneous, as before. No mediastinal lymphadenopathy. There is no hilar lymphadenopathy. Tiny hiatal hernia noted. Esophagus otherwise unremarkable. There is no axillary lymphadenopathy. Lungs/Pleura: The surgical changes noted right lung. No focal airspace consolidation. No pulmonary edema or pleural effusion. No suspicious pulmonary nodule or mass. Upper Abdomen: 6 mm hypervascular lesion in the dome of the liver is stable. Otherwise unremarkable. Musculoskeletal: Bone windows reveal no worrisome lytic or sclerotic osseous lesions. IMPRESSION: 1. Stable exam.  No new or progressive findings. 2. The 6 mm hypervascular focus in the dome of liver is stable. 3. Thyromegaly, stable. Electronically Signed   By: EMisty StanleyM.D.   On: 07/23/2016 18:14    ASSESSMENT: Stage I typical carcinoid.  PLAN:    1. Carcinoid tumor: CT results from July 23, 2016 reviewed independently and reported as above with no evidence of disease.  No treatment is necessary.  Patient does not have any symptoms of carcinoid syndrome, therefore octreotide would be of no help.  There is no data to suggest that  adjuvant octreotide without carcinoid syndrome would be useful.  Patient had her surgery on February 14, 2013. She has requested less frequent follow-up, therefore will reimage yearly until she is 5 years removed from surgery. Return to clinic in 1 year for further evaluation at which point patient likely can be discharged from clinic.   Patient expressed understanding and was in agreement with this plan. She also understands that She can call clinic at any time with any questions, concerns, or complaints.    TLloyd Huger MD   07/27/2016 11:28 AM

## 2016-07-27 ENCOUNTER — Inpatient Hospital Stay: Payer: Medicare Other | Attending: Oncology | Admitting: Oncology

## 2016-07-27 DIAGNOSIS — M810 Age-related osteoporosis without current pathological fracture: Secondary | ICD-10-CM

## 2016-07-27 DIAGNOSIS — Z7982 Long term (current) use of aspirin: Secondary | ICD-10-CM | POA: Diagnosis not present

## 2016-07-27 DIAGNOSIS — M1990 Unspecified osteoarthritis, unspecified site: Secondary | ICD-10-CM | POA: Diagnosis not present

## 2016-07-27 DIAGNOSIS — E559 Vitamin D deficiency, unspecified: Secondary | ICD-10-CM

## 2016-07-27 DIAGNOSIS — D3A00 Benign carcinoid tumor of unspecified site: Secondary | ICD-10-CM

## 2016-07-27 DIAGNOSIS — Z79899 Other long term (current) drug therapy: Secondary | ICD-10-CM | POA: Diagnosis not present

## 2016-07-27 DIAGNOSIS — Z85110 Personal history of malignant carcinoid tumor of bronchus and lung: Secondary | ICD-10-CM | POA: Diagnosis not present

## 2016-07-27 DIAGNOSIS — Z902 Acquired absence of lung [part of]: Secondary | ICD-10-CM | POA: Insufficient documentation

## 2016-12-30 DIAGNOSIS — E042 Nontoxic multinodular goiter: Secondary | ICD-10-CM | POA: Insufficient documentation

## 2016-12-30 DIAGNOSIS — E7800 Pure hypercholesterolemia, unspecified: Secondary | ICD-10-CM | POA: Insufficient documentation

## 2017-03-03 DIAGNOSIS — R7989 Other specified abnormal findings of blood chemistry: Secondary | ICD-10-CM | POA: Insufficient documentation

## 2017-05-17 ENCOUNTER — Emergency Department: Payer: Medicare Other

## 2017-05-17 ENCOUNTER — Other Ambulatory Visit: Payer: Self-pay

## 2017-05-17 ENCOUNTER — Emergency Department
Admission: EM | Admit: 2017-05-17 | Discharge: 2017-05-17 | Disposition: A | Discharge status: Home / Self Care | Payer: Medicare Other | Attending: Emergency Medicine | Admitting: Emergency Medicine

## 2017-05-17 ENCOUNTER — Encounter: Payer: Self-pay | Admitting: Emergency Medicine

## 2017-05-17 DIAGNOSIS — N200 Calculus of kidney: Secondary | ICD-10-CM | POA: Diagnosis not present

## 2017-05-17 DIAGNOSIS — Z79899 Other long term (current) drug therapy: Secondary | ICD-10-CM | POA: Diagnosis not present

## 2017-05-17 DIAGNOSIS — Z7982 Long term (current) use of aspirin: Secondary | ICD-10-CM | POA: Insufficient documentation

## 2017-05-17 DIAGNOSIS — R1031 Right lower quadrant pain: Secondary | ICD-10-CM | POA: Diagnosis present

## 2017-05-17 DIAGNOSIS — N201 Calculus of ureter: Secondary | ICD-10-CM | POA: Diagnosis not present

## 2017-05-17 DIAGNOSIS — J45909 Unspecified asthma, uncomplicated: Secondary | ICD-10-CM | POA: Insufficient documentation

## 2017-05-17 LAB — URINALYSIS, COMPLETE (UACMP) WITH MICROSCOPIC
Bacteria, UA: NONE SEEN
Bilirubin Urine: NEGATIVE
GLUCOSE, UA: NEGATIVE mg/dL
Ketones, ur: NEGATIVE mg/dL
Nitrite: NEGATIVE
Protein, ur: 30 mg/dL — AB
SPECIFIC GRAVITY, URINE: 1.019 (ref 1.005–1.030)
pH: 5 (ref 5.0–8.0)

## 2017-05-17 LAB — CBC
HCT: 43 % (ref 35.0–47.0)
Hemoglobin: 14.4 g/dL (ref 12.0–16.0)
MCH: 32 pg (ref 26.0–34.0)
MCHC: 33.4 g/dL (ref 32.0–36.0)
MCV: 95.7 fL (ref 80.0–100.0)
PLATELETS: 270 10*3/uL (ref 150–440)
RBC: 4.5 MIL/uL (ref 3.80–5.20)
RDW: 12.4 % (ref 11.5–14.5)
WBC: 8.4 10*3/uL (ref 3.6–11.0)

## 2017-05-17 LAB — COMPREHENSIVE METABOLIC PANEL WITH GFR
ALT: 24 U/L (ref 14–54)
AST: 26 U/L (ref 15–41)
Albumin: 4.4 g/dL (ref 3.5–5.0)
Alkaline Phosphatase: 75 U/L (ref 38–126)
Anion gap: 9 (ref 5–15)
BUN: 20 mg/dL (ref 6–20)
CO2: 27 mmol/L (ref 22–32)
Calcium: 10.2 mg/dL (ref 8.9–10.3)
Chloride: 102 mmol/L (ref 101–111)
Creatinine, Ser: 0.78 mg/dL (ref 0.44–1.00)
GFR calc Af Amer: >60 mL/min
GFR calc non Af Amer: >60 mL/min
Glucose, Bld: 151 mg/dL — ABNORMAL HIGH (ref 65–99)
Potassium: 4.1 mmol/L (ref 3.5–5.1)
Sodium: 138 mmol/L (ref 135–145)
Total Bilirubin: 0.4 mg/dL (ref 0.3–1.2)
Total Protein: 7.5 g/dL (ref 6.5–8.1)

## 2017-05-17 LAB — LIPASE, BLOOD: Lipase: 23 U/L (ref 11–51)

## 2017-05-17 MED ORDER — FENTANYL CITRATE (PF) 100 MCG/2ML IJ SOLN
50.0000 ug | Freq: Once | INTRAMUSCULAR | Status: AC
  Administered 2017-05-17: 50 ug via INTRAVENOUS
  Filled 2017-05-17: qty 2

## 2017-05-17 MED ORDER — ONDANSETRON HCL 4 MG/2ML IJ SOLN
4.0000 mg | Freq: Once | INTRAMUSCULAR | Status: AC | PRN
  Administered 2017-05-17: 4 mg via INTRAVENOUS
  Filled 2017-05-17: qty 2

## 2017-05-17 MED ORDER — PROMETHAZINE HCL 25 MG/ML IJ SOLN
12.5000 mg | Freq: Once | INTRAMUSCULAR | Status: AC
  Administered 2017-05-17: 12.5 mg via INTRAVENOUS
  Filled 2017-05-17: qty 1

## 2017-05-17 MED ORDER — KETOROLAC TROMETHAMINE 30 MG/ML IJ SOLN
15.0000 mg | Freq: Once | INTRAMUSCULAR | Status: AC
  Administered 2017-05-17: 15 mg via INTRAVENOUS
  Filled 2017-05-17: qty 1

## 2017-05-17 MED ORDER — ONDANSETRON 4 MG PO TBDP: 4.0000 mg | ORAL_TABLET | Freq: Three times a day (TID) | ORAL | 0 refills | Status: DC | PRN

## 2017-05-17 MED ORDER — SODIUM CHLORIDE 0.9 % IV BOLUS (SEPSIS)
1000.0000 mL | Freq: Once | INTRAVENOUS | Status: AC
  Administered 2017-05-17: 1000 mL via INTRAVENOUS

## 2017-05-17 MED ORDER — OXYCODONE-ACETAMINOPHEN 5-325 MG PO TABS: 1.0000 | ORAL_TABLET | Freq: Four times a day (QID) | ORAL | 0 refills | Status: DC | PRN

## 2017-05-17 MED ORDER — TAMSULOSIN HCL 0.4 MG PO CAPS: 0.4000 mg | ORAL_CAPSULE | Freq: Every day | ORAL | 0 refills | Status: DC

## 2017-05-17 NOTE — ED Provider Notes (Signed)
[facility] Emergency Department Provider Note  Time seen: 12:37 PM  I have reviewed the triage vital signs and the nursing notes.   HISTORY  Chief Complaint Abdominal Pain and Emesis    HPI Virginia Silva is a 82 y.o. female with a past medical history of asthma, lung cancer status post resection, presents to the emergency department for right flank pain.  According to the patient she had been feeling well until 10:00 this morning when she developed acute right flank pain, nausea and vomiting.  Patient denies any fever dysuria or hematuria.  No history of kidney stones.  Positive for vomiting but no diarrhea.  States mild right abdominal pain most of her pain is in her right back.  Denies any pain with movement or palpation.  Largely negative review of systems otherwise.   Past Medical History:  Diagnosis Date  . Allergic rhinitis due to allergen   . Asthma   . Cervicalgia   . Esophageal spasm   . Fibrocystic breast disease   . Hiatal hernia   . Hypercalcemia   . Hyperparathyroidism ([place])   . Lung cancer ([place]) 02/14/2013   Partial RML Lung Resection.  . Mitral valvular prolapse   . Osteoarthritis   . Palpitations   . Rosacea   . Senile osteoporosis   . Vitamin D deficiency     Patient Active Problem List   Diagnosis Date Noted  . Carcinoid tumor 07/25/2016    Past Surgical History:  Procedure Laterality Date  . ABDOMINAL HYSTERECTOMY    . CHOLECYSTECTOMY    . OOPHORECTOMY    . PARATHYROIDECTOMY  07/09/2015  . THORACOSCOPY WITH WEDGE RESECTION LUNG      Prior to Admission medications   Medication Sig Start Date End Date Taking? Authorizing Provider  aspirin EC 81 MG tablet Take by mouth. 07/09/15   [provider]  calcium carbonate (TUMS EX) 750 MG chewable tablet Chew by mouth. 07/09/15   [provider]  Cholecalciferol (VITAMIN D) 2000 units tablet Take by mouth.    [provider]  ondansetron (ZOFRAN-ODT) 4  MG disintegrating tablet  07/09/15   [provider]  triamcinolone cream (KENALOG) 0.1 %  07/14/15   [provider]    Allergies  Allergen Reactions  . Chloraprep One Step [Chlorhexidine Gluconate] Rash  . Erythromycin Rash  . Sulfa Antibiotics Rash    No family history on file.  Social History Social History   Tobacco Use  . Smoking status: Never Smoker  . Smokeless tobacco: Never Used  Substance Use Topics  . Alcohol use: No    Alcohol/week: 0.0 oz  . Drug use: No    Review of Systems Constitutional: Negative for fever. Eyes: Negative for visual changes. ENT: Negative for congestion Cardiovascular: Negative for chest pain. Respiratory: Negative for shortness of breath. Gastrointestinal: Right flank/back pain.  Positive for nausea and vomiting. Genitourinary: Negative for dysuria.  Liver hematuria. Musculoskeletal: Positive for right back pain Skin: Negative for rash. Neurological: Negative for headache All other ROS negative  ____________________________________________   PHYSICAL EXAM:  VITAL SIGNS: ED Triage Vitals  Enc Vitals Group     BP 05/17/17 1148 (!) 154/105     Pulse Rate 05/17/17 1148 66     Resp 05/17/17 1148 20     Temp 05/17/17 1148 97.8 F (36.6 C)     Temp Source 05/17/17 1148 Oral     SpO2 05/17/17 1148 99 %     Weight 05/17/17  1149 140 lb (63.5 kg)     Height 05/17/17 1149 5\' 8"  (1.727 m)     Head Circumference --      Peak Flow --      Pain Score 05/17/17 1148 6     Pain Loc --      Pain Edu? --      Excl. in [place]? --     Constitutional: Alert and oriented. Well appearing and in no distress. Eyes: Normal exam ENT   Head: Normocephalic and atraumatic   Mouth/Throat: Mucous membranes are moist. Cardiovascular: Normal rate, regular rhythm. No murmur Respiratory: Normal respiratory effort without tachypnea nor retractions. Breath sounds are clear Gastrointestinal: Soft and nontender. No distention.    Musculoskeletal: Nontender with normal range of motion in all extremities.  Neurologic:  Normal speech and language. No gross focal neurologic deficits  Skin:  Skin is warm, dry and intact.  Psychiatric: Mood and affect are normal.   ____________________________________________    RADIOLOGY  IMPRESSION: Mild right hydroureteronephrosis due to 3 mm distal ureteral calculus at the right ureterovesical junction.  Small bilateral intrarenal calculi.  Colonic diverticulosis, without radiographic evidence of diverticulitis.  ____________________________________________   INITIAL IMPRESSION / ASSESSMENT AND PLAN / ED COURSE  Pertinent labs & imaging results that were available during my care of the patient were reviewed by me and considered in my medical decision making (see chart for details).  Patient presented to the emergency department for right flank pain starting around 10:00 this morning.  Differential would include ureterolithiasis, pyelonephritis, urinary tract infection, appendicitis.  Patient is status post cholecystectomy.  Patient's labs are largely within normal limits, CBC pending.  Urinalysis has resulted showing too numerous to count red blood cells with calcium oxalate.  We will obtain a CT renal scan to further evaluate.  We will dose pain and nausea medication.  Patient agreeable to plan.  Patient's CT scan shows a distal right ureteral stone 3 mm.  Likely the cause of the patient's pain nausea and vomiting.  Patient reports her pain is decreased to a 5/10 but remains present we will dose a low-dose of Toradol for continued pain relief.  I discussed follow-up return precautions, urology follow-up.  We will discharge with Flomax, Percocet.  ____________________________________________   FINAL CLINICAL IMPRESSION(S) / ED DIAGNOSES kidney stone    Harvest Dark, MD 05/17/17 1330

## 2017-05-17 NOTE — Discharge Instructions (Signed)
As we discussed please use your urine strainer to attempt to capture the stone if you are able to please place it in a zip lock bag.  Please call urology for the next available appointment for recheck/reevaluation.  Please bring your stone to urology if you are able to capture it.  Please take your Flomax once daily drink plenty of fluids.  Return to the emergency department for any worsening pain, a fever, painful urination, or any other symptom personally concerning to yourself.

## 2017-05-17 NOTE — ED Notes (Signed)
Pt given ginger ale at this time, instructed to sip slowly as not to make herself vomit.

## 2017-05-17 NOTE — ED Triage Notes (Signed)
Lower abd pain, nausea and vomiting began this am.

## 2017-05-19 ENCOUNTER — Encounter: Payer: Self-pay | Admitting: Urology

## 2017-05-19 ENCOUNTER — Ambulatory Visit (INDEPENDENT_AMBULATORY_CARE_PROVIDER_SITE_OTHER): Payer: Medicare Other | Admitting: Urology

## 2017-05-19 VITALS — BP 124/74 | HR 105 | Ht 67.0 in | Wt 143.0 lb

## 2017-05-19 DIAGNOSIS — N200 Calculus of kidney: Secondary | ICD-10-CM

## 2017-05-19 LAB — URINE CULTURE

## 2017-05-19 NOTE — Progress Notes (Signed)
05/19/2017 10:13 AM   Virginia Silva [birth year] [id number]  Referring provider: Idelle Crouch, MD [place] [facility] [place], [place] [address]  Chief Complaint  Patient presents with  . Hydronephrosis    New Patient    HPI: The patient is a 82 year old female presents today for ER follow-up after being diagnosed with a distal 3 mm right ureteral calculus.  The patient notes that this is her first kidney stone.  She was having right flank pain, nausea, vomiting which brought her to the ER.  This is since resolved.  She is on Flomax.  She has not required pain pills.  She has been straining her urine without evidence of a stone.  She has never passed a calculus before.  Review of CT images does show a 3 mm distal right ureteral calculus.  There are also bilateral nonobstructing renal stones.  The largest is in the left lower pole at approximately 5 mm.  The remainder of the stones for the most part less than 3 mm.   PMH: Past Medical History:  Diagnosis Date  . Allergic rhinitis due to allergen   . Asthma   . Cervicalgia   . Esophageal spasm   . Fibrocystic breast disease   . Hiatal hernia   . Hypercalcemia   . Hyperparathyroidism ([place])   . Lung cancer ([place]) 02/14/2013   Partial RML Lung Resection.  . Mitral valvular prolapse   . Osteoarthritis   . Palpitations   . Rosacea   . Senile osteoporosis   . Vitamin D deficiency     Surgical History: Past Surgical History:  Procedure Laterality Date  . ABDOMINAL HYSTERECTOMY    . CHOLECYSTECTOMY    . OOPHORECTOMY    . PARATHYROIDECTOMY  07/09/2015  . THORACOSCOPY WITH WEDGE RESECTION LUNG      Home Medications:  Allergies as of 05/19/2017      Reactions   Chloraprep One Step [chlorhexidine Gluconate] Rash   Erythromycin Rash   Sulfa Antibiotics Rash      Medication List        Accurate as of 05/19/17 10:13 AM. Always use your most recent med list.          aspirin EC 81 MG tablet Take by  mouth.   calcium carbonate 750 MG chewable tablet Commonly known as:  TUMS EX Chew by mouth.   ondansetron 4 MG disintegrating tablet Commonly known as:  ZOFRAN ODT Take 1 tablet (4 mg total) by mouth every 8 (eight) hours as needed for nausea or vomiting.   oxyCODONE-acetaminophen 5-325 MG tablet Commonly known as:  ROXICET Take 1 tablet by mouth every 6 (six) hours as needed.   tamsulosin 0.4 MG Caps capsule Commonly known as:  FLOMAX Take 1 capsule (0.4 mg total) by mouth daily.   triamcinolone cream 0.1 % Commonly known as:  KENALOG   Vitamin D 2000 units tablet Take by mouth.       Allergies:  Allergies  Allergen Reactions  . Chloraprep One Step [Chlorhexidine Gluconate] Rash  . Erythromycin Rash  . Sulfa Antibiotics Rash    Family History: No family history on file.  Social History:  reports that  has never smoked. she has never used smokeless tobacco. She reports that she does not drink alcohol or use drugs.  ROS: UROLOGY Frequent Urination?: No Hard to postpone urination?: Yes Burning/pain with urination?: No Get up at night to urinate?: Yes Leakage of urine?: Yes Urine stream starts and  stops?: No Trouble starting stream?: No Do you have to strain to urinate?: No Blood in urine?: Yes Urinary tract infection?: Yes Sexually transmitted disease?: No Injury to kidneys or bladder?: No Painful intercourse?: No Weak stream?: No Currently pregnant?: No Vaginal bleeding?: No Last menstrual period?: n  Gastrointestinal Nausea?: Yes Vomiting?: Yes Indigestion/heartburn?: No Diarrhea?: No Constipation?: No  Constitutional Fever: No Night sweats?: No Weight loss?: No Fatigue?: No  Skin Skin rash/lesions?: No Itching?: No  Eyes Blurred vision?: No Double vision?: No  Ears/Nose/Throat Sore throat?: No Sinus problems?: No  Hematologic/Lymphatic Swollen glands?: No Easy bruising?: No  Cardiovascular Leg swelling?: No Chest pain?:  No  Respiratory Cough?: No Shortness of breath?: No  Endocrine Excessive thirst?: No  Musculoskeletal Back pain?: Yes Joint pain?: No  Neurological Headaches?: No Dizziness?: No  Psychologic Depression?: No Anxiety?: No  Physical Exam: BP 124/74   Pulse (!) 105   Ht 5\' 7"  (1.702 m)   Wt 143 lb (64.9 kg)   BMI 22.40 kg/m   Constitutional:  Alert and oriented, No acute distress. HEENT: [place] AT, moist mucus membranes.  Trachea midline, no masses. Cardiovascular: No clubbing, cyanosis, or edema. Respiratory: Normal respiratory effort, no increased work of breathing. GI: Abdomen is soft, nontender, nondistended, no abdominal masses GU: No CVA tenderness.  Skin: No rashes, bruises or suspicious lesions. Lymph: No cervical or inguinal adenopathy. Neurologic: Grossly intact, no focal deficits, moving all 4 extremities. Psychiatric: Normal mood and affect.  Laboratory Data: Lab Results  Component Value Date   WBC 8.4 05/17/2017   HGB 14.4 05/17/2017   HCT 43.0 05/17/2017   MCV 95.7 05/17/2017   PLT 270 05/17/2017    Lab Results  Component Value Date   CREATININE 0.78 05/17/2017    No results found for: PSA  No results found for: TESTOSTERONE  No results found for: HGBA1C  Urinalysis    Component Value Date/Time   COLORURINE YELLOW (A) 05/17/2017 1146   APPEARANCEUR CLEAR (A) 05/17/2017 1146   LABSPEC 1.019 05/17/2017 1146   PHURINE 5.0 05/17/2017 1146   GLUCOSEU NEGATIVE 05/17/2017 1146   HGBUR LARGE (A) 05/17/2017 1146   BILIRUBINUR NEGATIVE 05/17/2017 1146   KETONESUR NEGATIVE 05/17/2017 1146   PROTEINUR 30 (A) 05/17/2017 1146   NITRITE NEGATIVE 05/17/2017 1146   LEUKOCYTESUR TRACE (A) 05/17/2017 1146    Pertinent Imaging: CT reviewed as above  Assessment & Plan:   1.  Right ureteral calculus The patient is a small stone in the distal portion of her right ureter.  She will continue medical expulsive therapy with Flomax and straining of urine.   Due to its size, we will have her obtain a renal ultrasound instead of a KUB in 2 weeks to ensure stone passage.  2.  Bilateral nonobstructing stones We discussed possible treatment methods for the stones.  We discussed options including cystoscopy with ureteroscopy, active surveillance, lithotripsy.  She is not a good candidate for lithotripsy due to the number.  She is not interested in surgery at this time.  We will monitor these with repeat imaging in the future.  Return in about 2 weeks (around 06/02/2017) for renal u/s prior.  Nickie Retort, MD  [facility] [address], [place], [place] [address] [phone]

## 2017-05-30 ENCOUNTER — Ambulatory Visit
Admission: RE | Admit: 2017-05-30 | Discharge: 2017-05-30 | Disposition: A | Discharge status: Home / Self Care | Payer: Medicare Other | Source: Ambulatory Visit | Attending: Urology | Admitting: Urology

## 2017-05-30 DIAGNOSIS — N200 Calculus of kidney: Secondary | ICD-10-CM | POA: Insufficient documentation

## 2017-06-02 ENCOUNTER — Encounter: Payer: Self-pay | Admitting: Urology

## 2017-06-02 ENCOUNTER — Ambulatory Visit (INDEPENDENT_AMBULATORY_CARE_PROVIDER_SITE_OTHER): Payer: Medicare Other | Admitting: Urology

## 2017-06-02 VITALS — BP 145/80 | HR 91 | Ht 67.0 in | Wt 146.4 lb

## 2017-06-02 DIAGNOSIS — N200 Calculus of kidney: Secondary | ICD-10-CM

## 2017-06-02 NOTE — Progress Notes (Signed)
06/02/2017 1:28 PM   Virginia Silva December 20, 1935 [id number]  Referring provider: Idelle Crouch, MD [place] [facility] [place], [place] [address]  Chief Complaint  Patient presents with  . Follow-up    [place] results    HPI: The patient is a 82 year old female presents today for follow-up after being diagnosed with a distal 3 mm right ureteral calculus.   She was recently seen here for emergency department follow-up when she was still having intermittent flank pain and has not passed the stone yet.  Review of CT images did show a 3 mm distal right ureteral calculus.  There were also bilateral nonobstructing renal stones.  The largest is in the left lower pole at approximately 5 mm.  The remainder of the stones for the most part less than 3 mm.  Due to the small size of the stone, she underwent a renal ultrasound to ensure resolution of hydronephrosis.  The hydronephrosis has resolved at this time.  Her symptoms have also resolved.  This was her first kidney stone.   PMH: Past Medical History:  Diagnosis Date  . Allergic rhinitis due to allergen   . Asthma   . Cervicalgia   . Esophageal spasm   . Fibrocystic breast disease   . Hiatal hernia   . Hypercalcemia   . Hyperparathyroidism ([place])   . Lung cancer ([place]) 02/14/2013   Partial RML Lung Resection.  . Mitral valvular prolapse   . Osteoarthritis   . Palpitations   . Rosacea   . Senile osteoporosis   . Vitamin D deficiency     Surgical History: Past Surgical History:  Procedure Laterality Date  . ABDOMINAL HYSTERECTOMY    . CHOLECYSTECTOMY    . OOPHORECTOMY    . PARATHYROIDECTOMY  07/09/2015  . THORACOSCOPY WITH WEDGE RESECTION LUNG      Home Medications:  Allergies as of 06/02/2017      Reactions   Chloraprep One Step [chlorhexidine Gluconate] Rash   Erythromycin Rash   Sulfa Antibiotics Rash      Medication List        Accurate as of 06/02/17  1:28 PM. Always use your most recent med  list.          aspirin EC 81 MG tablet Take by mouth.   calcium carbonate 750 MG chewable tablet Commonly known as:  TUMS EX Chew by mouth.   tamsulosin 0.4 MG Caps capsule Commonly known as:  FLOMAX Take 0.4 mg by mouth daily.   triamcinolone cream 0.1 % Commonly known as:  KENALOG   Vitamin D 2000 units tablet Take by mouth.       Allergies:  Allergies  Allergen Reactions  . Chloraprep One Step [Chlorhexidine Gluconate] Rash  . Erythromycin Rash  . Sulfa Antibiotics Rash    Family History: History reviewed. No pertinent family history.  Social History:  reports that  has never smoked. she has never used smokeless tobacco. She reports that she does not drink alcohol or use drugs.  ROS:                                        Physical Exam: BP (!) 145/80 (BP Location: Right Arm, Patient Position: Sitting, Cuff Size: Normal)   Pulse 91   Ht 5\' 7"  (1.702 m)   Wt 146 lb 6.4 oz (66.4 kg)   BMI 22.93 kg/m  Constitutional:  Alert and oriented, No acute distress. HEENT: [place] AT, moist mucus membranes.  Trachea midline, no masses. Cardiovascular: No clubbing, cyanosis, or edema. Respiratory: Normal respiratory effort, no increased work of breathing. GI: Abdomen is soft, nontender, nondistended, no abdominal masses GU: No CVA tenderness.  Skin: No rashes, bruises or suspicious lesions. Lymph: No cervical or inguinal adenopathy. Neurologic: Grossly intact, no focal deficits, moving all 4 extremities. Psychiatric: Normal mood and affect.  Laboratory Data: Lab Results  Component Value Date   WBC 8.4 05/17/2017   HGB 14.4 05/17/2017   HCT 43.0 05/17/2017   MCV 95.7 05/17/2017   PLT 270 05/17/2017    Lab Results  Component Value Date   CREATININE 0.78 05/17/2017    No results found for: PSA  No results found for: TESTOSTERONE  No results found for: HGBA1C  Urinalysis    Component Value Date/Time   COLORURINE YELLOW (A)  05/17/2017 1146   APPEARANCEUR CLEAR (A) 05/17/2017 1146   LABSPEC 1.019 05/17/2017 1146   PHURINE 5.0 05/17/2017 1146   GLUCOSEU NEGATIVE 05/17/2017 1146   HGBUR LARGE (A) 05/17/2017 1146   BILIRUBINUR NEGATIVE 05/17/2017 1146   KETONESUR NEGATIVE 05/17/2017 1146   PROTEINUR 30 (A) 05/17/2017 1146   NITRITE NEGATIVE 05/17/2017 1146   LEUKOCYTESUR TRACE (A) 05/17/2017 1146    Pertinent Imaging: Renal ultrasound reviewed with resolution of hydronephrosis  Assessment & Plan:    1.  Right ureteral calculus This has passed based on ultrasound findings showing resolution of hydronephrosis.  No further workup required.  2.  Bilateral nonobstructing stones We discussed possible treatment methods for the stones.   She remains uninterested in treating the stones.  We will have her follow-up in 1 year with a KUB prior  Return in about 1 year (around 06/02/2018) for KUB prior.  Nickie Retort, MD  [facility] [address], [place],  [address] [phone]

## 2017-07-27 ENCOUNTER — Ambulatory Visit
Admission: RE | Admit: 2017-07-27 | Discharge: 2017-07-27 | Disposition: A | Discharge status: Home / Self Care | Payer: Medicare Other | Source: Ambulatory Visit | Attending: Oncology | Admitting: Oncology

## 2017-07-27 DIAGNOSIS — D3A00 Benign carcinoid tumor of unspecified site: Secondary | ICD-10-CM | POA: Diagnosis present

## 2017-07-27 MED ORDER — IOPAMIDOL (ISOVUE-300) INJECTION 61%
75.0000 mL | Freq: Once | INTRAVENOUS | Status: AC | PRN
  Administered 2017-07-27: 75 mL via INTRAVENOUS

## 2017-07-31 NOTE — Progress Notes (Signed)
Virginia Silva  [phone] [phone]  ID: PAYDEN BONUS OB: 17-May-1936  MR#: [id number]  KGU#:[id number]  Patient Care Team: Idelle Crouch, MD as PCP - General (Internal Medicine)  CHIEF COMPLAINT: Carcinoid-typical, stage I  INTERVAL HISTORY: Patient returns to clinic today for routine yearly evaluation and discussion of [name].  [name] and remains asymptomatic.  She has no neurologic complaints.  She has a good appetite and denies weight loss.  She denies any recent fevers or illnesses.  She denies any chest pain, shortness of breath, cough, or hemoptysis.  She has no nausea, vomiting, constipation, or diarrhea.  She has no urinary complaints.  Patient offers no specific complaints today.  REVIEW OF SYSTEMS:   Review of Systems  Constitutional: Negative.  Negative for fever, malaise/fatigue and weight loss.  Respiratory: Negative.  Negative for cough, hemoptysis and shortness of breath.   Cardiovascular: Negative.  Negative for chest pain and leg swelling.  Gastrointestinal: Negative.  Negative for abdominal pain and diarrhea.  Genitourinary: Negative.   Musculoskeletal: Negative.   Skin: Negative.  Negative for rash.  Neurological: Negative.  Negative for sensory change, focal weakness and weakness.  Psychiatric/Behavioral: Negative.  The patient is not nervous/anxious.     As per HPI. Otherwise, a complete review of systems is negative.  PAST MEDICAL HISTORY: Past Medical History:  Diagnosis Date  . Allergic rhinitis due to allergen   . Asthma   . Cervicalgia   . Esophageal spasm   . Fibrocystic breast disease   . Hiatal hernia   . Hypercalcemia   . Hyperparathyroidism ([place])   . Lung cancer ([place]) 02/14/2013   Partial RML Lung Resection.  . Mitral valvular prolapse   . Osteoarthritis   . Palpitations   . Rosacea   . Senile osteoporosis   . Vitamin D deficiency     PAST SURGICAL HISTORY: Past  Surgical History:  Procedure Laterality Date  . ABDOMINAL HYSTERECTOMY    . CHOLECYSTECTOMY    . OOPHORECTOMY    . PARATHYROIDECTOMY  07/09/2015  . THORACOSCOPY WITH WEDGE RESECTION LUNG      FAMILY HISTORY: Reviewed and unchanged.  No reported history of malignancy or chronic disease.     ADVANCED DIRECTIVES:    HEALTH MAINTENANCE: Social History   Tobacco Use  . Smoking status: Never Smoker  . Smokeless tobacco: Never Used  Substance Use Topics  . Alcohol use: No    Alcohol/week: 0.0 oz  . Drug use: No     Colonoscopy:  PAP:  Bone density:  Lipid panel:  Allergies  Allergen Reactions  . Chloraprep One Step [Chlorhexidine Gluconate] Rash  . Erythromycin Rash  . Sulfa Antibiotics Rash    Current Outpatient Medications  Medication Sig Dispense Refill  . aspirin EC 81 MG tablet Take by mouth.    . calcium carbonate (TUMS EX) 750 MG chewable tablet Chew by mouth.    . Cholecalciferol (VITAMIN D) 2000 units tablet Take 2,000 Units by mouth daily.     Marland Kitchen triamcinolone cream (KENALOG) 0.1 %      No current facility-administered medications for this visit.     OBJECTIVE: Vitals:   08/01/17 1048  BP: 132/82  Pulse: 72  Resp: 18  Temp: 98.4 F (36.9 C)     Body mass index is 22.76 kg/m.    ECOG FS:0 - Asymptomatic  General: Well-developed, well-nourished, no acute distress. Eyes: Pink conjunctiva, anicteric sclera. Lungs: Clear to  auscultation bilaterally. Heart: Regular rate and rhythm. No rubs, murmurs, or gallops. Abdomen: Soft, nontender, nondistended. No organomegaly noted, normoactive bowel sounds. Musculoskeletal: No edema, cyanosis, or clubbing. Neuro: Alert, answering all questions appropriately. Cranial nerves grossly intact. Skin: No rashes or petechiae noted. Psych: Normal affect.   LAB RESULTS:  Lab Results  Component Value Date   NA 138 05/17/2017   K 4.1 05/17/2017   CL 102 05/17/2017   CO2 27 05/17/2017   GLUCOSE 151 (H) 05/17/2017     BUN 20 05/17/2017   CREATININE 0.78 05/17/2017   CALCIUM 10.2 05/17/2017   PROT 7.5 05/17/2017   ALBUMIN 4.4 05/17/2017   AST 26 05/17/2017   ALT 24 05/17/2017   ALKPHOS 75 05/17/2017   BILITOT 0.4 05/17/2017   GFRNONAA >60 05/17/2017   GFRAA >60 05/17/2017    Lab Results  Component Value Date   WBC 8.4 05/17/2017   NEUTROABS 6.1 02/17/2013   HGB 14.4 05/17/2017   HCT 43.0 05/17/2017   MCV 95.7 05/17/2017   PLT 270 05/17/2017     STUDIES: Ct Chest W Contrast  Result Date: 07/27/2017 CLINICAL DATA:  Status post right middle lobectomy 02/14/2013 for carcinoid. Patient presents for routine restaging. EXAM: CT CHEST WITH CONTRAST TECHNIQUE: Multidetector CT imaging of the chest was performed during intravenous contrast administration. CONTRAST:  88mL ISOVUE-300 IOPAMIDOL (ISOVUE-300) INJECTION 61% COMPARISON:  07/23/2016 chest CT. FINDINGS: Cardiovascular: Normal heart size. No significant pericardial fluid/thickening. Left anterior descending coronary atherosclerosis. Atherosclerotic nonaneurysmal thoracic aorta. Stable top-normal caliber main pulmonary artery (3.0 cm diameter). No central pulmonary emboli. Mediastinum/Nodes: Stable heterogeneous mild goiter with scattered internal calcifications and without discrete nodules. Unremarkable esophagus. No pathologically enlarged axillary, mediastinal or hilar lymph nodes. Lungs/Pleura: Status post right middle lobectomy. No pneumothorax. No pleural effusion. No acute consolidative airspace disease, lung masses or significant pulmonary nodules. Mild dependent scarring versus hypoventilatory change in the lower lobes. Upper abdomen: Small hiatal hernia. Cholecystectomy. Hypervascular 0.9 cm right liver dome lesion (series 2/image 107) is stable since 07/02/2013 chest CT using similar measurement technique, considered benign. Musculoskeletal: No aggressive appearing focal osseous lesions. Moderate thoracic spondylosis. IMPRESSION: 1. No evidence  of local tumor recurrence status post right middle lobectomy. 2. No evidence of metastatic disease in the chest. 3. One vessel coronary atherosclerosis. 4. Small hiatal hernia. Aortic Atherosclerosis (ICD10-I70.0). Electronically Signed   By: Ilona Sorrel M.D.   On: 07/27/2017 14:17    ASSESSMENT: Stage I typical carcinoid.  PLAN:    1. Carcinoid tumor: CT results from July 27, 2017 reviewed independently and reported as above with no evidence of disease.  No treatment is necessary.  Patient does not have any symptoms of carcinoid syndrome, therefore octreotide would be of no help.  There is no data to suggest that adjuvant octreotide without carcinoid syndrome would be useful.  Patient had her surgery on February 14, 2013.  Patient is now nearly 5 years removed from her surgery with no evidence of disease.  After discussion with the patient, it was agreed upon that no further follow-up is necessary.  She does not require additional CT scans unless there is suspicion of recurrence.    Approximately 20 minutes spent in discussion of which greater than 50% was consultation.  Patient expressed understanding and was in agreement with this plan. She also understands that She can call clinic at any time with any questions, concerns, or complaints.    Lloyd Huger, MD   08/01/2017 11:19 AM

## 2017-08-01 ENCOUNTER — Inpatient Hospital Stay: Payer: Medicare Other | Attending: Oncology | Admitting: Oncology

## 2017-08-01 ENCOUNTER — Other Ambulatory Visit: Payer: Self-pay

## 2017-08-01 VITALS — BP 132/82 | HR 72 | Temp 98.4°F | Resp 18 | Wt 145.3 lb

## 2017-08-01 DIAGNOSIS — D3A00 Benign carcinoid tumor of unspecified site: Secondary | ICD-10-CM

## 2017-08-01 DIAGNOSIS — Z85110 Personal history of malignant carcinoid tumor of bronchus and lung: Secondary | ICD-10-CM | POA: Insufficient documentation

## 2017-08-01 NOTE — Progress Notes (Signed)
Here for follow up. stated doing well.

## 2018-03-06 IMAGING — CT CT RENAL STONE PROTOCOL
2 of 4 series · 16 of 46 positions shown, 18 images · non-contrast
Comparison: None.

CLINICAL DATA: Right-sided flank pain and nausea with vomiting
beginning this morning.

EXAM:
CT ABDOMEN AND PELVIS WITHOUT CONTRAST
TECHNIQUE: Multidetector CT imaging of the abdomen and pelvis was performed
following the standard protocol without IV contrast.

[Series 2: stone full standard · axial · 0.71mm/px · z∈[-1218,-778]mm · 13 of 97 slices shown, 15 images]
[im 5/97  soft-tissue]
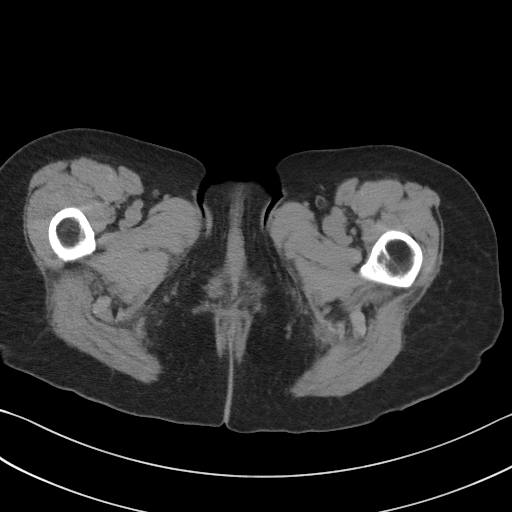
[im 5/97  bone]
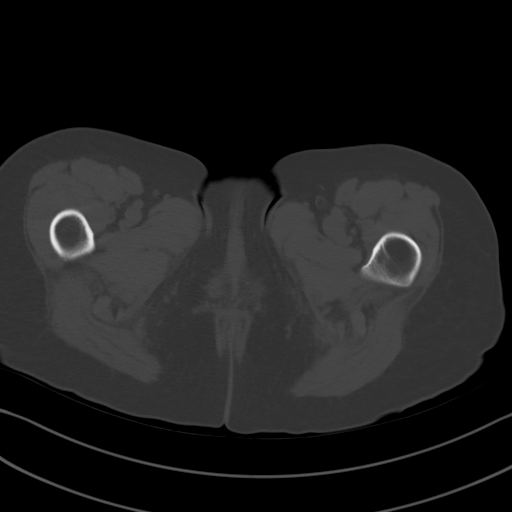
[im 13/97  soft-tissue]
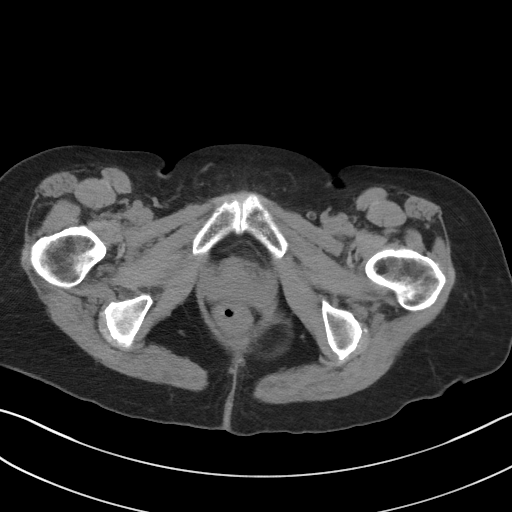
[im 21/97  soft-tissue]
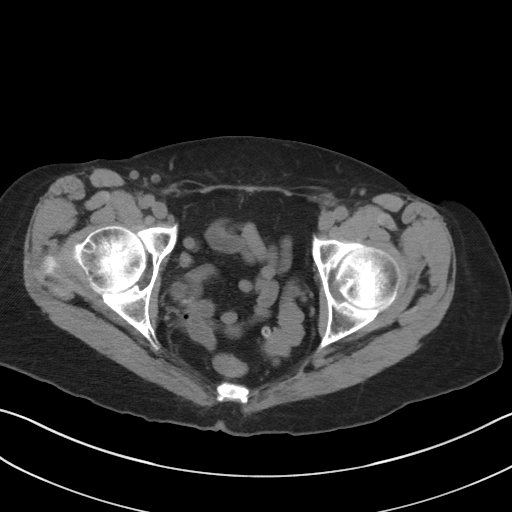
[im 29/97  soft-tissue]
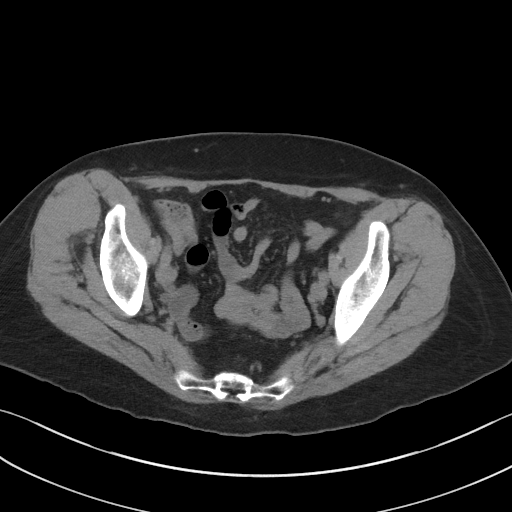
[im 33/97  soft-tissue]
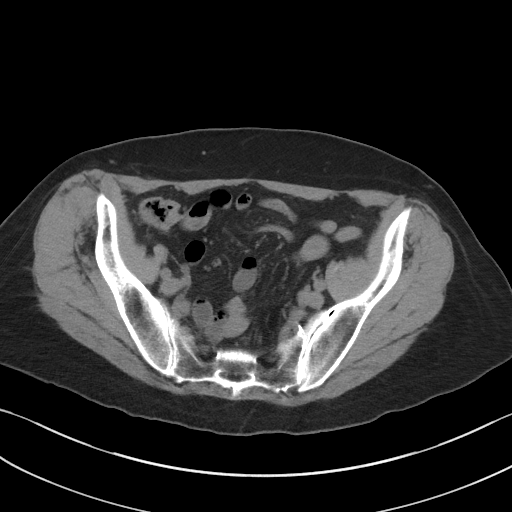
[im 41/97  soft-tissue]
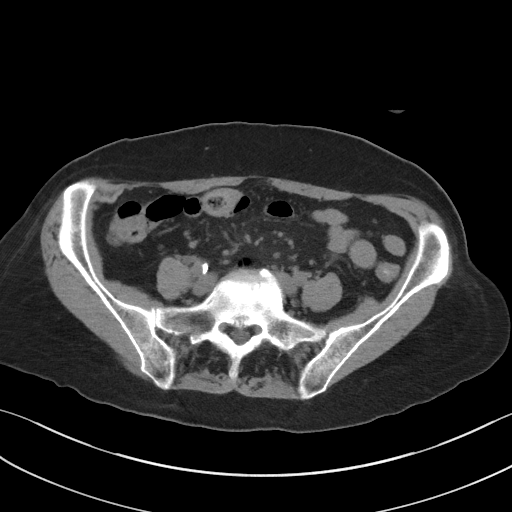
[im 49/97  soft-tissue]
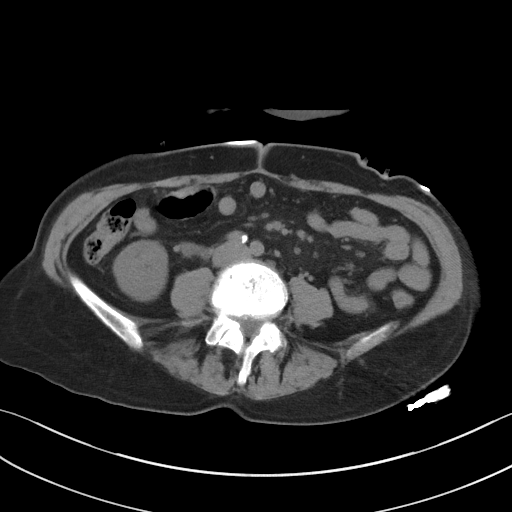
[im 57/97  soft-tissue]
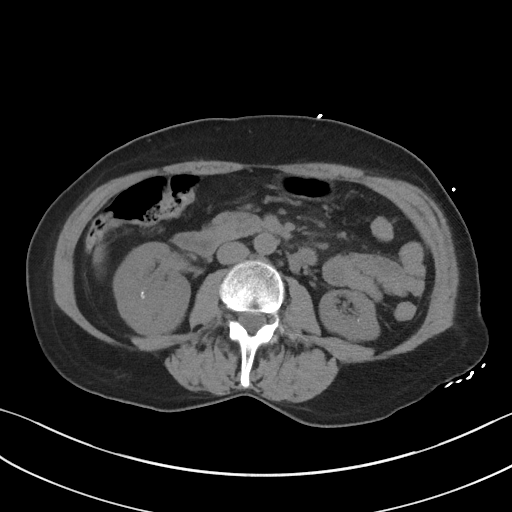
[im 65/97  soft-tissue]
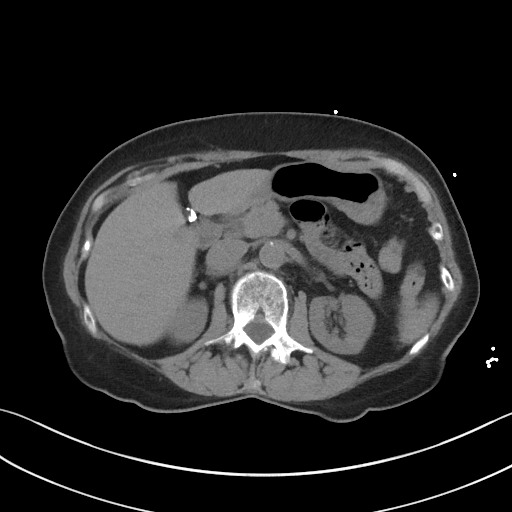
[im 65/97  bone]
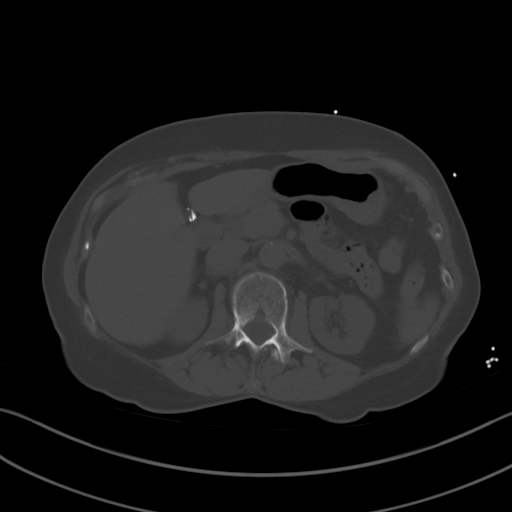
[im 69/97  soft-tissue]
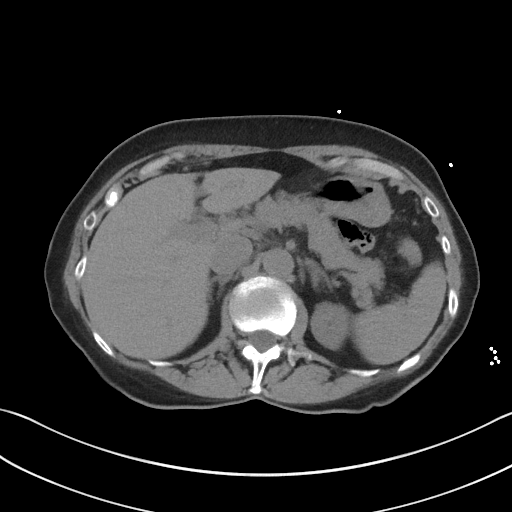
[im 77/97  soft-tissue]
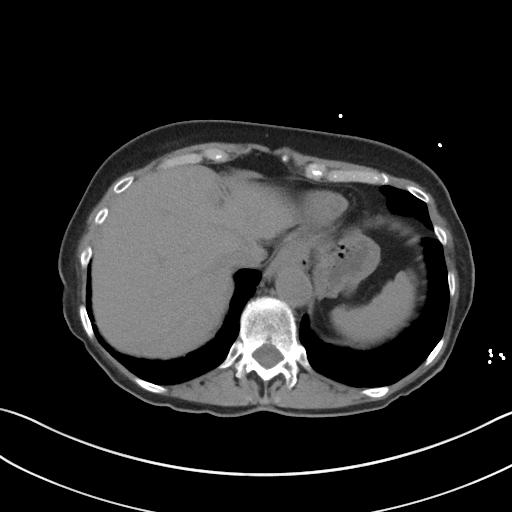
[im 85/97  soft-tissue]
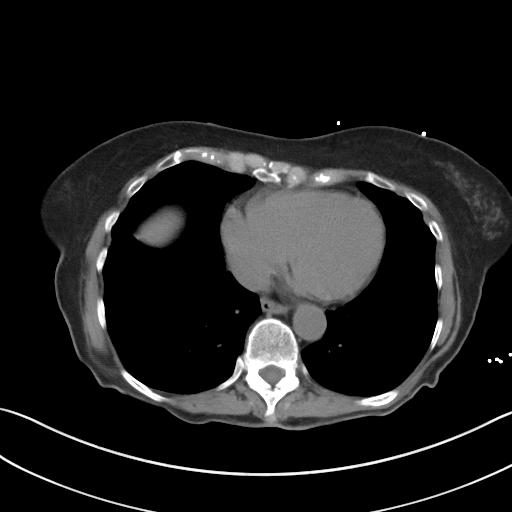
[im 93/97  soft-tissue]
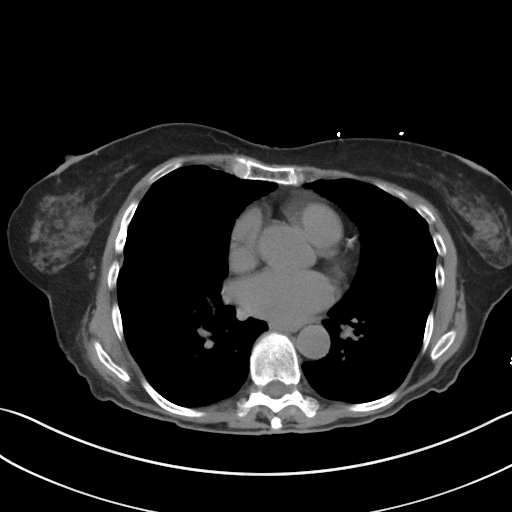

[Series 5: coronal · coronal · 0.70mm/px · 3 of 105 slices shown]
[im 35/105  soft-tissue]
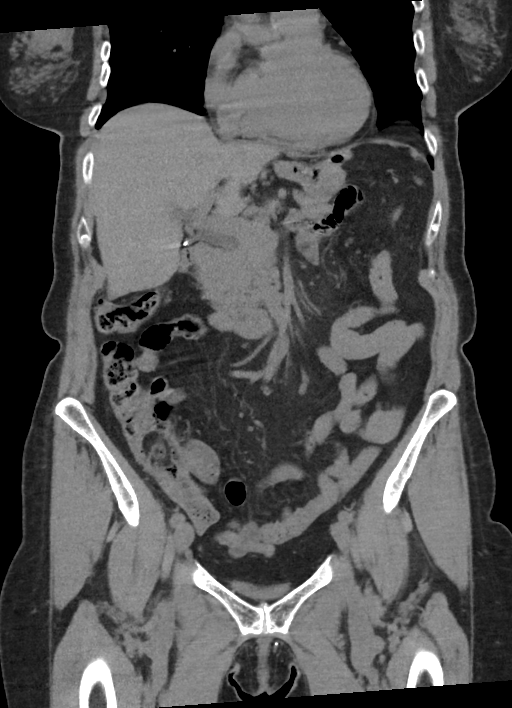
[im 47/105  soft-tissue]
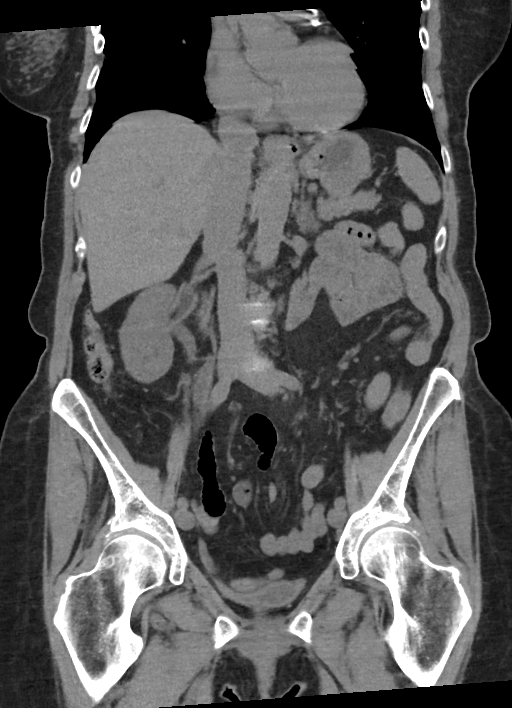
[im 58/105  soft-tissue]
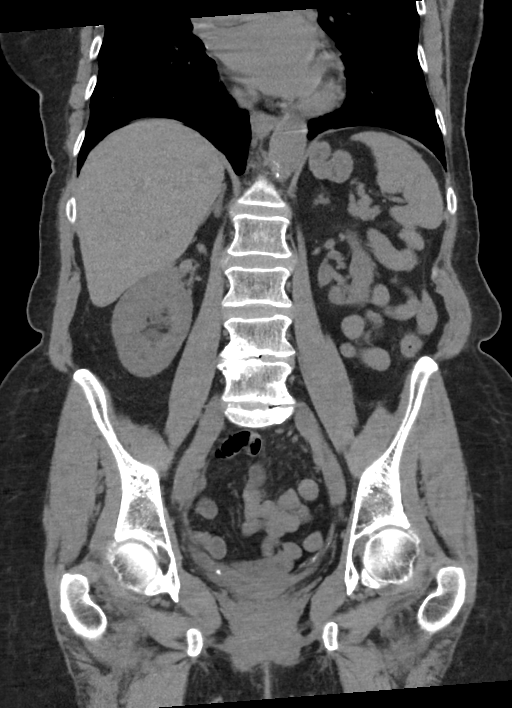

[16 of 46 positions shown; findings below may reference images not displayed]

FINDINGS: Lower chest: No acute findings.

Hepatobiliary: No mass visualized on this unenhanced exam. Prior
cholecystectomy. No evidence of biliary obstruction.

Pancreas: No mass or inflammatory process visualized on this
unenhanced exam.

Spleen:  Within normal limits in size.

Adrenals/Urinary tract: Multiple small less than 5 mm intrarenal
calculi are seen bilaterally. Mild right hydroureteronephrosis is
seen due to a 3 mm calculus at the right ureterovesical junction.

Stomach/Bowel: No evidence of obstruction, inflammatory process, or
abnormal fluid collections. Sigmoid diverticulosis is noted, however
there is no evidence of diverticulitis.

Vascular/Lymphatic: No pathologically enlarged lymph nodes
identified. No evidence of abdominal aortic aneurysm. Aortic
atherosclerosis.

Reproductive: Prior hysterectomy noted. Adnexal regions are
unremarkable in appearance.

Other:  None.

Musculoskeletal: No suspicious bone lesions identified. Severe
degenerative disc disease noted at L4-5.
IMPRESSION: Mild right hydroureteronephrosis due to 3 mm distal ureteral
calculus at the right ureterovesical junction.

Small bilateral intrarenal calculi.

Colonic diverticulosis, without radiographic evidence of
diverticulitis.

## 2018-06-02 ENCOUNTER — Ambulatory Visit
Admission: RE | Admit: 2018-06-02 | Discharge: 2018-06-02 | Disposition: A | Discharge status: Home / Self Care | Payer: Medicare Other | Source: Ambulatory Visit | Attending: Urology | Admitting: Urology

## 2018-06-02 DIAGNOSIS — N200 Calculus of kidney: Secondary | ICD-10-CM | POA: Diagnosis not present

## 2018-06-05 ENCOUNTER — Ambulatory Visit: Payer: Medicare Other | Admitting: Urology

## 2018-06-07 ENCOUNTER — Ambulatory Visit (INDEPENDENT_AMBULATORY_CARE_PROVIDER_SITE_OTHER): Payer: Medicare Other | Admitting: Urology

## 2018-06-07 ENCOUNTER — Encounter: Payer: Self-pay | Admitting: Urology

## 2018-06-07 VITALS — BP 128/71 | HR 89 | Ht 68.0 in | Wt 144.0 lb

## 2018-06-07 DIAGNOSIS — N6019 Diffuse cystic mastopathy of unspecified breast: Secondary | ICD-10-CM | POA: Insufficient documentation

## 2018-06-07 DIAGNOSIS — I341 Nonrheumatic mitral (valve) prolapse: Secondary | ICD-10-CM | POA: Insufficient documentation

## 2018-06-07 DIAGNOSIS — R002 Palpitations: Secondary | ICD-10-CM | POA: Insufficient documentation

## 2018-06-07 DIAGNOSIS — N200 Calculus of kidney: Secondary | ICD-10-CM | POA: Diagnosis not present

## 2018-06-07 DIAGNOSIS — K224 Dyskinesia of esophagus: Secondary | ICD-10-CM | POA: Insufficient documentation

## 2018-06-07 DIAGNOSIS — R32 Unspecified urinary incontinence: Secondary | ICD-10-CM

## 2018-06-07 DIAGNOSIS — K449 Diaphragmatic hernia without obstruction or gangrene: Secondary | ICD-10-CM | POA: Insufficient documentation

## 2018-06-07 DIAGNOSIS — L719 Rosacea, unspecified: Secondary | ICD-10-CM | POA: Insufficient documentation

## 2018-06-07 DIAGNOSIS — M542 Cervicalgia: Secondary | ICD-10-CM | POA: Insufficient documentation

## 2018-06-07 DIAGNOSIS — M810 Age-related osteoporosis without current pathological fracture: Secondary | ICD-10-CM | POA: Insufficient documentation

## 2018-06-07 DIAGNOSIS — J45909 Unspecified asthma, uncomplicated: Secondary | ICD-10-CM | POA: Insufficient documentation

## 2018-06-07 DIAGNOSIS — M1990 Unspecified osteoarthritis, unspecified site: Secondary | ICD-10-CM | POA: Insufficient documentation

## 2018-06-07 DIAGNOSIS — J309 Allergic rhinitis, unspecified: Secondary | ICD-10-CM | POA: Insufficient documentation

## 2018-06-07 NOTE — Progress Notes (Signed)
06/07/2018 9:18 AM   Virginia Silva July 01, 1935 [id number]  Referring provider: Idelle Crouch, MD [facility] [place], [place] [address]  Chief Complaint  Patient presents with  . Follow-up    HPI: 83 year old female followed for bilateral nephrolithiasis presents for annual follow-up.  CT performed January 2019 showed a 3 mm right distal ureteral calculus which she subsequently passed and bilateral, nonobstructing renal calculi the largest in the left lower pole measuring approximately 5 mm.  Over the last 12 months she denies flank or abdominal pain.  No symptoms suspicious for renal colic.  She denies gross hematuria.  She does complain of urinary incontinence.  It is not stress or urge related.   PMH: Past Medical History:  Diagnosis Date  . Allergic rhinitis due to allergen   . Asthma   . Cervicalgia   . Esophageal spasm   . Fibrocystic breast disease   . Hiatal hernia   . Hypercalcemia   . Hyperparathyroidism ([place])   . Lung cancer ([place]) 02/14/2013   Partial RML Lung Resection.  . Mitral valvular prolapse   . Osteoarthritis   . Palpitations   . Rosacea   . Senile osteoporosis   . Vitamin D deficiency     Surgical History: Past Surgical History:  Procedure Laterality Date  . ABDOMINAL HYSTERECTOMY    . CHOLECYSTECTOMY    . OOPHORECTOMY    . PARATHYROIDECTOMY  07/09/2015  . THORACOSCOPY WITH WEDGE RESECTION LUNG      Home Medications:  Allergies as of 06/07/2018      Reactions   Chloraprep One Step [chlorhexidine Gluconate] Rash   Erythromycin Rash   Sulfa Antibiotics Rash      Medication List       Accurate as of June 07, 2018  9:18 AM. Always use your most recent med list.        aspirin EC 81 MG tablet Take by mouth.   calcium carbonate 750 MG chewable tablet Commonly known as:  TUMS EX Chew by mouth.   methimazole 5 MG tablet Commonly known as:  TAPAZOLE Take 2.5 mg by mouth daily.   triamcinolone  cream 0.1 % Commonly known as:  KENALOG   Vitamin D 50 MCG (2000 UT) tablet Take 2,000 Units by mouth daily.       Allergies:  Allergies  Allergen Reactions  . Chloraprep One Step [Chlorhexidine Gluconate] Rash  . Erythromycin Rash  . Sulfa Antibiotics Rash    Family History: No family history on file.  Social History:  reports that she has never smoked. She has never used smokeless tobacco. She reports that she does not drink alcohol or use drugs.  ROS: UROLOGY Frequent Urination?: No Hard to postpone urination?: Yes Burning/pain with urination?: No Get up at night to urinate?: Yes Leakage of urine?: Yes Urine stream starts and stops?: No Trouble starting stream?: No Do you have to strain to urinate?: No Blood in urine?: No Urinary tract infection?: No Sexually transmitted disease?: No Injury to kidneys or bladder?: No Painful intercourse?: No Weak stream?: No Currently pregnant?: No Vaginal bleeding?: No Last menstrual period?: n  Gastrointestinal Nausea?: No Vomiting?: No Indigestion/heartburn?: No Diarrhea?: No Constipation?: No  Constitutional Fever: No Night sweats?: No Weight loss?: No Fatigue?: No  Skin Skin rash/lesions?: No Itching?: No  Eyes Blurred vision?: No Double vision?: No  Ears/Nose/Throat Sore throat?: No Sinus problems?: No  Hematologic/Lymphatic Swollen glands?: No Easy bruising?: No  Cardiovascular Leg swelling?: No Chest  pain?: No  Respiratory Cough?: No Shortness of breath?: No  Endocrine Excessive thirst?: No  Musculoskeletal Back pain?: No Joint pain?: No  Neurological Headaches?: No Dizziness?: No  Psychologic Depression?: No Anxiety?: No  Physical Exam: BP 128/71   Pulse 89   Ht 5\' 8"  (1.727 m)   Wt 144 lb (65.3 kg)   BMI 21.90 kg/m   Constitutional:  Alert and oriented, No acute distress. HEENT: [place] AT, moist mucus membranes.  Trachea midline, no masses. Cardiovascular: No clubbing,  cyanosis, or edema. Respiratory: Normal respiratory effort, no increased work of breathing. Neurologic: Grossly intact, no focal deficits, moving all 4 extremities. Psychiatric: Normal mood and affect.  Laboratory Data:  Urinalysis Dipstick/microscopy negative  Pertinent Imaging: Images were personally reviewed Results for orders placed during the hospital encounter of 06/02/18  Abdomen 1 view (KUB)   Narrative CLINICAL DATA:  Nephrolithiasis.  EXAM: ABDOMEN - 1 VIEW  COMPARISON:  CT scan of May 17, 2017.  FINDINGS: The bowel gas pattern is normal. Bilateral nephrolithiasis is noted.  IMPRESSION: Bilateral nephrolithiasis. No evidence of bowel obstruction or ileus.   Electronically Signed   By: Marijo Conception, M.D.   On: 06/02/2018 11:56    Assessment & Plan:   83 year old female with bilateral nephrolithiasis.  She is presently asymptomatic.  We discussed continued surveillance and shockwave lithotripsy.  She desires to continue surveillance and will follow-up in 1 year.  She will return earlier for development of renal colic.  I suspect her urinary incontinence is probably stress related.  She has no frequency or urgency.  I did discuss PT referral for pelvic floor strengthening however she would like to hold off at present.  Abbie Sons, [facility] [address] [place], [place] [address] [phone]

## 2018-07-18 ENCOUNTER — Other Ambulatory Visit: Payer: Self-pay | Admitting: Internal Medicine

## 2018-07-18 DIAGNOSIS — D3A090 Benign carcinoid tumor of the bronchus and lung: Secondary | ICD-10-CM

## 2018-07-21 ENCOUNTER — Ambulatory Visit
Admission: RE | Admit: 2018-07-21 | Discharge: 2018-07-21 | Disposition: A | Discharge status: Home / Self Care | Payer: Medicare Other | Source: Ambulatory Visit | Attending: Internal Medicine | Admitting: Internal Medicine

## 2018-07-21 ENCOUNTER — Other Ambulatory Visit: Payer: Self-pay

## 2018-07-21 DIAGNOSIS — D3A090 Benign carcinoid tumor of the bronchus and lung: Secondary | ICD-10-CM | POA: Insufficient documentation

## 2018-07-21 MED ORDER — IOHEXOL 300 MG/ML  SOLN
60.0000 mL | Freq: Once | INTRAMUSCULAR | Status: AC | PRN
  Administered 2018-07-21: 60 mL via INTRAVENOUS

## 2019-06-07 ENCOUNTER — Ambulatory Visit (INDEPENDENT_AMBULATORY_CARE_PROVIDER_SITE_OTHER): Payer: Medicare Other | Admitting: Urology

## 2019-06-07 ENCOUNTER — Other Ambulatory Visit: Payer: Self-pay

## 2019-06-07 ENCOUNTER — Encounter: Payer: Self-pay | Admitting: Urology

## 2019-06-07 ENCOUNTER — Ambulatory Visit
Admission: RE | Admit: 2019-06-07 | Discharge: 2019-06-07 | Disposition: A | Discharge status: Home / Self Care | Payer: Medicare Other | Source: Ambulatory Visit | Attending: Urology | Admitting: Urology

## 2019-06-07 VITALS — BP 165/95 | HR 90 | Ht 68.0 in | Wt 144.0 lb

## 2019-06-07 DIAGNOSIS — R8271 Bacteriuria: Secondary | ICD-10-CM

## 2019-06-07 DIAGNOSIS — N200 Calculus of kidney: Secondary | ICD-10-CM

## 2019-06-07 DIAGNOSIS — N39498 Other specified urinary incontinence: Secondary | ICD-10-CM | POA: Diagnosis not present

## 2019-06-07 LAB — BLADDER SCAN AMB NON-IMAGING

## 2019-06-07 NOTE — Progress Notes (Signed)
06/07/2019 9:29 AM   Virginia Silva [birth year] [id number]  Referring provider: Idelle Crouch, MD [facility] [place],  [place] [address]  Chief Complaint  Patient presents with  . Nephrolithiasis    Urologic history: 1.  Bilateral nephrolithiasis   HPI: 84 y.o. female presents for annual follow-up.  In the last 12 months she denies flank pain/renal colic.  She saw Dr. Doy Hutching in October 2020 and had noted vague pelvic discomfort.  Urinalysis showed 4-10 WBCs and urine culture grew Enterococcus.  She was treated with Cipro but saw no change in her symptoms.  Urinalysis was not repeated.  She states Dr. Doy Hutching did give her a Rx for cefuroxime and she filled the prescription though did not start the medication.  She denies dysuria, frequency or urgency.  She does complain of unaware incontinence which she feels is worsening.  Denies stress or urge incontinence.   PMH: Past Medical History:  Diagnosis Date  . Allergic rhinitis due to allergen   . Cervicalgia   . Esophageal spasm   . Fibrocystic breast disease   . Hiatal hernia   . Hypercalcemia   . Hyperparathyroidism ([place])   . Lung cancer ([place]) 02/14/2013   Partial RML Lung Resection.  . Mitral valvular prolapse   . Osteoarthritis   . Palpitations   . Rosacea   . Senile osteoporosis   . Vitamin D deficiency     Surgical History: Past Surgical History:  Procedure Laterality Date  . ABDOMINAL HYSTERECTOMY    . CHOLECYSTECTOMY    . OOPHORECTOMY    . PARATHYROIDECTOMY  07/09/2015  . THORACOSCOPY WITH WEDGE RESECTION LUNG      Home Medications:  Allergies as of 06/07/2019      Reactions   Chloraprep One Step [chlorhexidine Gluconate] Rash   Erythromycin Rash   Sulfa Antibiotics Rash      Medication List       Accurate as of June 07, 2019  9:29 AM. If you have any questions, ask your nurse or doctor.        aspirin EC 81 MG tablet Take by mouth.   calcium carbonate 750  MG chewable tablet Commonly known as: TUMS EX Chew by mouth.   methimazole 5 MG tablet Commonly known as: TAPAZOLE Take 2.5 mg by mouth daily.   triamcinolone cream 0.1 % Commonly known as: KENALOG   Vitamin D 50 MCG (2000 UT) tablet Take 2,000 Units by mouth daily.       Allergies:  Allergies  Allergen Reactions  . Chloraprep One Step [Chlorhexidine Gluconate] Rash  . Erythromycin Rash  . Sulfa Antibiotics Rash    Family History: No family history on file.  Social History:  reports that she has never smoked. She has never used smokeless tobacco. She reports that she does not drink alcohol or use drugs.  ROS:                                        Physical Exam: BP (!) 165/95   Pulse 90   Ht 5\' 8"  (1.727 m)   Wt 144 lb (65.3 kg)   BMI 21.90 kg/m   Constitutional:  Alert and oriented, No acute distress. HEENT: [place] AT, moist mucus membranes.  Trachea midline, no masses. Cardiovascular: No clubbing, cyanosis, or edema. Respiratory: Normal respiratory effort, no increased work of breathing. Neurologic: Grossly intact,  no focal deficits, moving all 4 extremities. Psychiatric: Normal mood and affect.     Assessment & Plan:    - Nephrolithiasis KUB performed there was reviewed.  There is a small calcification overlying the right renal outline and 2 calcifications overlying the lower pole of the left renal outline which are stable when compared with KUB of last year.  There is also a 3 mm calcification just lateral to the L4-5 interspace which could represent a small ureteral calculus.  She denies flank pain and would recommend a follow-up KUB in approximately 6 weeks.  - Bacteriuria She has vague pelvic discomfort and we discussed this may not be related to her previously noted bacteriuria.  We will repeat her urinalysis and she will be notified with the results.  - Unaware incontinence No stress or urge symptoms.  PVR by bladder scan was 5  mL.  Recommended a follow-up with Dr. Matilde Sprang.  Trial Myrbetriq 25 mg while waiting for this appointment.   Abbie Sons, [place] [facility] [address] [place], [place] [address] [phone]

## 2019-06-08 ENCOUNTER — Telehealth: Payer: Self-pay | Admitting: *Deleted

## 2019-06-08 LAB — MICROSCOPIC EXAMINATION

## 2019-06-08 LAB — URINALYSIS, COMPLETE
Bilirubin, UA: NEGATIVE
Glucose, UA: NEGATIVE
Ketones, UA: NEGATIVE
Nitrite, UA: NEGATIVE
Protein,UA: NEGATIVE
Specific Gravity, UA: 1.025 (ref 1.005–1.030)
Urobilinogen, Ur: 0.2 mg/dL (ref 0.2–1.0)
pH, UA: 5.5 (ref 5.0–7.5)

## 2019-06-08 NOTE — Telephone Encounter (Signed)
-----   Message from Abbie Sons, MD sent at 06/08/2019  9:59 AM EST ----- Urinalysis did show white blood cells.  Have her start the cefuroxime she got filled.  If she is having persistent symptoms after completing the course recommend a repeat urinalysis and culture.

## 2019-06-08 NOTE — Telephone Encounter (Signed)
Notified patient as instructed, patient pleased. Discussed follow-up appointments, patient agrees  

## 2019-07-09 ENCOUNTER — Encounter: Payer: Self-pay | Admitting: Urology

## 2019-07-09 ENCOUNTER — Ambulatory Visit (INDEPENDENT_AMBULATORY_CARE_PROVIDER_SITE_OTHER): Payer: Medicare Other | Admitting: Urology

## 2019-07-09 ENCOUNTER — Other Ambulatory Visit: Payer: Self-pay

## 2019-07-09 VITALS — BP 135/75 | Temp 89.0°F | Ht 68.0 in | Wt 149.0 lb

## 2019-07-09 DIAGNOSIS — N3000 Acute cystitis without hematuria: Secondary | ICD-10-CM | POA: Diagnosis not present

## 2019-07-09 DIAGNOSIS — R32 Unspecified urinary incontinence: Secondary | ICD-10-CM

## 2019-07-09 LAB — URINALYSIS, COMPLETE
Bilirubin, UA: NEGATIVE
Glucose, UA: NEGATIVE
Ketones, UA: NEGATIVE
Nitrite, UA: NEGATIVE
Protein,UA: NEGATIVE
Specific Gravity, UA: 1.025 (ref 1.005–1.030)
Urobilinogen, Ur: 0.2 mg/dL (ref 0.2–1.0)
pH, UA: 6 (ref 5.0–7.5)

## 2019-07-09 LAB — MICROSCOPIC EXAMINATION

## 2019-07-09 NOTE — Progress Notes (Signed)
07/09/2019 8:52 AM   Virginia Silva 1936-02-23 [id number]  Referring provider: Idelle Crouch, MD [facility] [place],  [place] [address]  Chief Complaint  Patient presents with  . Follow-up    HPI Patient was recently seen by Dr. Chauncey Cruel and placed on Myrbetriq.  She never tried it.  She leaks without awareness.  She denies stress incontinence bedwetting and urge incontinence.  She was wearing 1-3 pads a day moderately wet to soaked.  She was called with a positive urine culture and since then the last 2 weeks has been continent.  Apparently she was having some vague pelvic discomfort.  Postvoid residual was 5 mL  She has had a hysterectomy.  No previous bladder surgery.     PMH: Past Medical History:  Diagnosis Date  . Allergic rhinitis due to allergen   . Cervicalgia   . Esophageal spasm   . Fibrocystic breast disease   . Hiatal hernia   . Hypercalcemia   . Hyperparathyroidism ([place])   . Lung cancer ([place]) 02/14/2013   Partial RML Lung Resection.  . Mitral valvular prolapse   . Osteoarthritis   . Palpitations   . Rosacea   . Senile osteoporosis   . Vitamin D deficiency     Surgical History: Past Surgical History:  Procedure Laterality Date  . ABDOMINAL HYSTERECTOMY    . CHOLECYSTECTOMY    . OOPHORECTOMY    . PARATHYROIDECTOMY  07/09/2015  . THORACOSCOPY WITH WEDGE RESECTION LUNG      Home Medications:  Allergies as of 07/09/2019      Reactions   Chloraprep One Step [chlorhexidine Gluconate] Rash   Erythromycin Rash   Sulfa Antibiotics Rash      Medication List       Accurate as of July 09, 2019  8:52 AM. If you have any questions, ask your nurse or doctor.        STOP taking these medications   triamcinolone cream 0.1 % Commonly known as: KENALOG Stopped by: Reece Packer, MD     TAKE these medications   aspirin EC 81 MG tablet Take by mouth.   calcium carbonate 750 MG chewable tablet Commonly known as:  TUMS EX Chew by mouth.   methimazole 5 MG tablet Commonly known as: TAPAZOLE Take 2.5 mg by mouth daily.   Vitamin D 50 MCG (2000 UT) tablet Take 2,000 Units by mouth daily.       Allergies:  Allergies  Allergen Reactions  . Chloraprep One Step [Chlorhexidine Gluconate] Rash  . Erythromycin Rash  . Sulfa Antibiotics Rash    Family History: No family history on file.  Social History:  reports that she has never smoked. She has never used smokeless tobacco. She reports that she does not drink alcohol or use drugs.  ROS:                                        Physical Exam: BP 135/75   Temp (!) 89 F (31.7 C)   Ht 5\' 8"  (1.727 m)   Wt 149 lb (67.6 kg)   BMI 22.66 kg/m   Constitutional:  Alert and oriented, No acute distress. HEENT: [place] AT, moist mucus membranes.  Trachea midline, no masses. Cardiovascular: No clubbing, cyanosis, or edema. Respiratory: Normal respiratory effort, no increased work of breathing. GI: Abdomen is soft, nontender, nondistended, no abdominal  masses GU: No introitus no stress incontinence or prolapse Skin: No rashes, bruises or suspicious lesions. Lymph: No cervical or inguinal adenopathy. Neurologic: Grossly intact, no focal deficits, moving all 4 extremities. Psychiatric: Normal mood and affect.  Laboratory Data: Lab Results  Component Value Date   WBC 8.4 05/17/2017   HGB 14.4 05/17/2017   HCT 43.0 05/17/2017   MCV 95.7 05/17/2017   PLT 270 05/17/2017    Lab Results  Component Value Date   CREATININE 0.78 05/17/2017    No results found for: PSA  No results found for: TESTOSTERONE  No results found for: HGBA1C  Urinalysis    Component Value Date/Time   COLORURINE YELLOW (A) 05/17/2017 1146   APPEARANCEUR Hazy (A) 06/07/2019 0908   LABSPEC 1.019 05/17/2017 1146   PHURINE 5.0 05/17/2017 1146   GLUCOSEU Negative 06/07/2019 0908   HGBUR LARGE (A) 05/17/2017 1146   BILIRUBINUR Negative 06/07/2019  0908   KETONESUR NEGATIVE 05/17/2017 1146   PROTEINUR Negative 06/07/2019 0908   PROTEINUR 30 (A) 05/17/2017 1146   NITRITE Negative 06/07/2019 0908   NITRITE NEGATIVE 05/17/2017 1146   LEUKOCYTESUR 1+ (A) 06/07/2019 0908    Pertinent Imaging:   Assessment & Plan: Patient likely was having bladder overactivity secondary to bladder infection not perceived as awareness.  Call if urine culture is positive.  Otherwise see as needed   There are no diagnoses linked to this encounter.  No follow-ups on file.  Reece Packer, MD  [facility] [address] [place], [place] [address] [phone]

## 2019-07-11 LAB — CULTURE, URINE COMPREHENSIVE

## 2019-07-12 ENCOUNTER — Other Ambulatory Visit: Payer: Self-pay | Admitting: *Deleted

## 2019-07-12 MED ORDER — CIPROFLOXACIN HCL 250 MG PO TABS: 250.0000 mg | ORAL_TABLET | Freq: Two times a day (BID) | ORAL | 0 refills | Status: AC

## 2019-07-12 NOTE — Telephone Encounter (Signed)
Sent rx to [facility] for 7 days. Notified patient as instructed, patient pleased. Discussed follow-up appointments, patient agrees

## 2020-06-05 ENCOUNTER — Other Ambulatory Visit: Payer: Self-pay

## 2020-06-05 ENCOUNTER — Other Ambulatory Visit: Payer: Self-pay | Admitting: Family Medicine

## 2020-06-05 ENCOUNTER — Ambulatory Visit
Admission: RE | Admit: 2020-06-05 | Discharge: 2020-06-05 | Disposition: A | Discharge status: Home / Self Care | Payer: Medicare Other | Source: Ambulatory Visit | Attending: Urology | Admitting: Urology

## 2020-06-05 ENCOUNTER — Ambulatory Visit
Admission: RE | Admit: 2020-06-05 | Discharge: 2020-06-05 | Disposition: A | Discharge status: Home / Self Care | Payer: Medicare Other | Attending: Urology | Admitting: Urology

## 2020-06-05 DIAGNOSIS — N200 Calculus of kidney: Secondary | ICD-10-CM | POA: Insufficient documentation

## 2020-06-06 ENCOUNTER — Encounter: Payer: Self-pay | Admitting: Urology

## 2020-06-06 ENCOUNTER — Ambulatory Visit (INDEPENDENT_AMBULATORY_CARE_PROVIDER_SITE_OTHER): Payer: Medicare Other | Admitting: Urology

## 2020-06-06 VITALS — BP 152/77 | HR 93 | Ht 68.0 in | Wt 145.0 lb

## 2020-06-06 DIAGNOSIS — N3942 Incontinence without sensory awareness: Secondary | ICD-10-CM | POA: Diagnosis not present

## 2020-06-06 DIAGNOSIS — N200 Calculus of kidney: Secondary | ICD-10-CM

## 2020-06-06 NOTE — Progress Notes (Signed)
06/06/2020 9:09 AM   Virginia Silva 26-Sep-1935 [id number]  Referring provider: Idelle Crouch, MD [facility] [place],  [place] [address]  Chief Complaint  Patient presents with  . Nephrolithiasis    HPI: 85 y.o. female presents for follow-up of nephrolithiasis.   Last visit with me January 2021  She was having bothersome unaware incontinence and I recommended she see Dr. Matilde Sprang  My notes indicate she was given Myrbetriq 25 mg while waiting for that appointment however she states she did not take any medication for this problem  She saw Dr. Matilde Sprang and his note indicated her incontinence had resolved after being treated for UTI  She states her incontinence has not resolved, it is variable sometimes small and sometimes large volume  No recurrent stone symptoms including flank/abdominal pain or hematuria   PMH: Past Medical History:  Diagnosis Date  . Allergic rhinitis due to allergen   . Cervicalgia   . Esophageal spasm   . Fibrocystic breast disease   . Hiatal hernia   . Hypercalcemia   . Hyperparathyroidism ([place])   . Lung cancer ([place]) 02/14/2013   Partial RML Lung Resection.  . Mitral valvular prolapse   . Osteoarthritis   . Palpitations   . Rosacea   . Senile osteoporosis   . Vitamin D deficiency     Surgical History: Past Surgical History:  Procedure Laterality Date  . ABDOMINAL HYSTERECTOMY    . CHOLECYSTECTOMY    . OOPHORECTOMY    . PARATHYROIDECTOMY  07/09/2015  . THORACOSCOPY WITH WEDGE RESECTION LUNG      Home Medications:  Allergies as of 06/06/2020      Reactions   Chloraprep One Step [chlorhexidine Gluconate] Rash   Erythromycin Rash   Sulfa Antibiotics Rash      Medication List       Accurate as of June 06, 2020  9:09 AM. If you have any questions, ask your nurse or doctor.        STOP taking these medications   aspirin EC 81 MG tablet Stopped by: Abbie Sons, MD     TAKE these  medications   calcium carbonate 750 MG chewable tablet Commonly known as: TUMS EX Chew by mouth.   methimazole 5 MG tablet Commonly known as: TAPAZOLE Take 2.5 mg by mouth daily.   Vitamin D 50 MCG (2000 UT) tablet Take 2,000 Units by mouth daily.       Allergies:  Allergies  Allergen Reactions  . Chloraprep One Step [Chlorhexidine Gluconate] Rash  . Erythromycin Rash  . Sulfa Antibiotics Rash    Family History: No family history on file.  Social History:  reports that she has never smoked. She has never used smokeless tobacco. She reports that she does not drink alcohol and does not use drugs.   Physical Exam: BP (!) 152/77   Pulse 93   Ht 5\' 8"  (1.727 m)   Wt 145 lb (65.8 kg)   BMI 22.05 kg/m   Constitutional:  Alert, No acute distress. HEENT: [place] AT, moist mucus membranes.  Trachea midline, no masses. Cardiovascular: No clubbing, cyanosis, or edema. Respiratory: Normal respiratory effort, no increased work of breathing. Psychiatric: Normal mood and affect.   Pertinent Imaging: KUB images personally reviewed and interpreted  Abdomen 1 view (KUB)  Narrative CLINICAL DATA:  Follow-up renal calculi.  EXAM: ABDOMEN - 1 VIEW  COMPARISON:  06/07/2019  FINDINGS: Unremarkable bowel gas pattern. The soft tissue shadows maintained. Small  left renal calculi are noted. Suspect a small upper pole right renal calculus. No definite ureteral calculi or bladder calculi.  The bony structures are intact. Chondrocalcinosis noted at both hip joints.  IMPRESSION: Small bilateral renal calculi.   Electronically Signed By: Marijo Sanes M.D. On: 06/05/2020 16:12   Assessment & Plan:    1.  Nephrolithiasis  Small, bilateral renal calculi, asymptomatic  1 year follow-up with KUB  2. Urinary incontinence  Trial Myrbetriq 25 mg daily  Potential side effect of elevated blood pressure discussed  She will call back regarding efficacy  If no improvement we  discussed referral back to Dr. Matilde Sprang or a [facility] referral   Abbie Sons, [place] [address], [place], [place] [address] [phone]

## 2020-06-27 ENCOUNTER — Telehealth: Payer: Self-pay

## 2020-06-27 NOTE — Telephone Encounter (Signed)
Pt LM on triage line stating that she was given a month of myrbetriq samples and was told to call back with effectiveness. Pt states that she believes samples have been helpful however checked with her insurance and they will not cover the medication until she has tried and failed other drugs in this class. Pt states no names of medications were given. Please advise on alternative.  Pt also c/o UTI symptoms. Pt requests to drop off urine sample, advised pt that we are no longer doing that at our office scheduled pt in next available slot.

## 2020-06-30 ENCOUNTER — Other Ambulatory Visit: Payer: Self-pay

## 2020-06-30 ENCOUNTER — Ambulatory Visit (INDEPENDENT_AMBULATORY_CARE_PROVIDER_SITE_OTHER): Payer: Medicare Other | Admitting: Physician Assistant

## 2020-06-30 ENCOUNTER — Encounter: Payer: Self-pay | Admitting: Physician Assistant

## 2020-06-30 VITALS — BP 141/80 | HR 94 | Ht 66.0 in | Wt 145.0 lb

## 2020-06-30 DIAGNOSIS — R32 Unspecified urinary incontinence: Secondary | ICD-10-CM

## 2020-06-30 DIAGNOSIS — R3129 Other microscopic hematuria: Secondary | ICD-10-CM | POA: Diagnosis not present

## 2020-06-30 NOTE — Progress Notes (Signed)
06/30/2020 4:55 PM   [name] Jul 03, 1935 [id number]  CC: Chief Complaint  Patient presents with   Recurrent UTI   HPI: Virginia Silva is a 85 y.o. female with PMH nephrolithiasis and urinary incontinence on Myrbetriq 25 mg daily who presents today for evaluation of possible UTI.  Today, patient reports on unclear history of intermittent "gnawing" sensation in the lower abdomen.  She believes this started before her last clinic visit with Dr. Bernardo Heater on 06/06/2020.  She has also experienced some occasional twinges of pain in her right hemiabdomen.  She denies fever, chills, nausea, and vomiting.  Additionally, patient reports significant improvement in urinary incontinence on Myrbetriq 25 mg daily.  Unfortunately, her insurance company will not cover this medication unless she has failed others previously.  She is a never smoker but reports significant secondhand exposure.  She has no family history of urinary malignancy.  No occupational exposure to industrial dye and no history of autoimmune conditions or for cyclophosphamide use.    She underwent KUB the day prior to her last clinic visit which revealed small bilateral renal calculi with no evidence of acute stone episode.  In-office UA today positive for 2+ blood and trace leukocyte esterase; urine microscopy with >30 RBCs/HPF.  PMH: Past Medical History:  Diagnosis Date   Allergic rhinitis due to allergen    Cervicalgia    Esophageal spasm    Fibrocystic breast disease    Hiatal hernia    Hypercalcemia    Hyperparathyroidism ([place])    Lung cancer ([place]) 02/14/2013   Partial RML Lung Resection.   Mitral valvular prolapse    Osteoarthritis    Palpitations    Rosacea    Senile osteoporosis    Vitamin D deficiency     Surgical History: Past Surgical History:  Procedure Laterality Date   ABDOMINAL HYSTERECTOMY     CHOLECYSTECTOMY     OOPHORECTOMY     PARATHYROIDECTOMY  07/09/2015    THORACOSCOPY WITH WEDGE RESECTION LUNG      Home Medications:  Allergies as of 06/30/2020      Reactions   Chloraprep One Step [chlorhexidine Gluconate] Rash   Erythromycin Rash   Sulfa Antibiotics Rash      Medication List       Accurate as of June 30, 2020  4:55 PM. If you have any questions, ask your nurse or doctor.        calcium carbonate 750 MG chewable tablet Commonly known as: TUMS EX Chew by mouth.   methimazole 5 MG tablet Commonly known as: TAPAZOLE Take 2.5 mg by mouth daily.   Vitamin D 50 MCG (2000 UT) tablet Take 2,000 Units by mouth daily.       Allergies:  Allergies  Allergen Reactions   Chloraprep One Step [Chlorhexidine Gluconate] Rash   Erythromycin Rash   Sulfa Antibiotics Rash    Family History: No family history on file.  Social History:   reports that she has never smoked. She has never used smokeless tobacco. She reports that she does not drink alcohol and does not use drugs.  Physical Exam: BP (!) 141/80    Pulse 94    Ht 5\' 6"  (1.676 m)    Wt 145 lb (65.8 kg)    BMI 23.40 kg/m   Constitutional:  Alert and oriented, no acute distress, nontoxic appearing HEENT: [place], AT Cardiovascular: No clubbing, cyanosis, or edema Respiratory: Normal respiratory effort, no increased work of breathing Skin: No rashes, bruises or suspicious  lesions Neurologic: Grossly intact, no focal deficits, moving all 4 extremities Psychiatric: Normal mood and affect  Laboratory Data: Results for orders placed or performed in visit on 06/30/20  Microscopic Examination   Urine  Result Value Ref Range   WBC, UA 0-5 0 - 5 /hpf   RBC >30 (A) 0 - 2 /hpf   Epithelial Cells (non renal) 0-10 0 - 10 /hpf   Renal Epithel, UA 0-10 (A) None seen /hpf   Mucus, UA Present (A) Not Estab.   Bacteria, UA Few None seen/Few  Urinalysis, Complete  Result Value Ref Range   Specific Gravity, UA 1.020 1.005 - 1.030   pH, UA 7.0 5.0 - 7.5   Color, UA Yellow Yellow    Appearance Ur Hazy (A) Clear   Leukocytes,UA Trace (A) Negative   Protein,UA Negative    Glucose, UA Negative Negative   Ketones, UA Negative Negative   RBC, UA 2+ (A) Negative   Bilirubin, UA Negative Negative   Urobilinogen, Ur 0.2 0.2 - 1.0 mg/dL   Nitrite, UA Negative Negative   Microscopic Examination See below:    Assessment & Plan:   1. Microscopic hematuria Otherwise benign UA today, though will send for culture to confirm.  With her history of nephrolithiasis and recent lower abdominal discomfort, I believe she may have a UVJ stone that did not appear on her recent KUB.  I recommend CT stone study for further evaluation at this time.  Patient is in agreement with this plan. - Urinalysis, Complete - CT RENAL STONE STUDY; Future - CULTURE, URINE COMPREHENSIVE   2. Urinary incontinence, unspecified type Significantly improved on Myrbetriq 25 mg daily.  Unclear if an underlying stone episode is contributory.  I gave her additional samples of Myrbetriq today and will revisit this medication based on her CT results.  Return for Will call with results.  Debroah Loop, PA-C  [facility] [address] [place], [place] [address] [phone]

## 2020-07-01 LAB — MICROSCOPIC EXAMINATION: RBC: >30 /hpf — AB (ref 0–2)

## 2020-07-01 LAB — URINALYSIS, COMPLETE
Bilirubin, UA: NEGATIVE
Glucose, UA: NEGATIVE
Ketones, UA: NEGATIVE
Nitrite, UA: NEGATIVE
Protein,UA: NEGATIVE
Specific Gravity, UA: 1.02 (ref 1.005–1.030)
Urobilinogen, Ur: 0.2 mg/dL (ref 0.2–1.0)
pH, UA: 7 (ref 5.0–7.5)

## 2020-07-02 LAB — CULTURE, URINE COMPREHENSIVE

## 2020-07-03 LAB — CULTURE, URINE COMPREHENSIVE

## 2020-07-04 ENCOUNTER — Telehealth: Payer: Self-pay | Admitting: Family Medicine

## 2020-07-04 NOTE — Telephone Encounter (Signed)
[facility] informed patient of results.

## 2020-07-04 NOTE — Telephone Encounter (Signed)
-----   Message from Nori Riis, PA-C sent at 07/04/2020 12:43 PM EST ----- Please let Mrs. Gassen know that her urine culture is negative.

## 2020-07-16 ENCOUNTER — Other Ambulatory Visit: Payer: Self-pay

## 2020-07-16 ENCOUNTER — Ambulatory Visit
Admission: RE | Admit: 2020-07-16 | Discharge: 2020-07-16 | Disposition: A | Discharge status: Home / Self Care | Payer: Medicare Other | Source: Ambulatory Visit | Attending: Physician Assistant | Admitting: Physician Assistant

## 2020-07-16 DIAGNOSIS — R3129 Other microscopic hematuria: Secondary | ICD-10-CM | POA: Diagnosis not present

## 2020-07-21 ENCOUNTER — Telehealth: Payer: Self-pay

## 2020-07-21 NOTE — Telephone Encounter (Signed)
Notified patient as advised, at this time patient would like to think about scheduling cysto. Patient states she will call back if she decides to proceed.

## 2020-07-21 NOTE — Telephone Encounter (Signed)
-----   Message from Debroah Loop, [place] sent at 07/21/2020  4:13 PM EST ----- Please contact the patient and inform her that her CT scan shows no acute stone episode.  She has bilateral, nonobstructing kidney stones, and while they may contribute to blood in the urine, they would not account for her discomfort.  Based on this, I would like her to come into clinic for cystoscopy with Dr. Bernardo Heater for evaluation of her bladder.  Please schedule this. ----- Message ----- From: Interface, Rad Results In Sent: 07/16/2020   7:01 PM EST To: Debroah Loop, PA-C

## 2020-07-22 NOTE — Telephone Encounter (Signed)
Please explain to the patient that I strongly encourage her to proceed with cystoscopy.  There are a number of things that can cause blood in the urine, including infection and kidney stones as well as more serious things including bladder cancers.  Her CT scan is not the definitive test for these and by deferring cystoscopy, we are unable to rule out more serious diagnoses.

## 2021-04-27 ENCOUNTER — Other Ambulatory Visit: Payer: Self-pay | Admitting: *Deleted

## 2021-04-27 DIAGNOSIS — N200 Calculus of kidney: Secondary | ICD-10-CM

## 2021-05-05 IMAGING — CT CT RENAL STONE PROTOCOL
2 of 4 series · 15 of 46 positions shown, 17 images · non-contrast
Comparison: CT renal 05/17/2017

CLINICAL DATA: Pt c/o worsening urinary incontinence and her urine
was checked when she went to the urologist for her appointment and
microscopic hematuria was found. Flank pain, kidney stone suspected.

EXAM:
CT ABDOMEN AND PELVIS WITHOUT CONTRAST
TECHNIQUE: Multidetector CT imaging of the abdomen and pelvis was performed
following the standard protocol without IV contrast.

[Series 2: renal stone 5.00 · axial · 0.65mm/px · z∈[-1696,-1296]mm · 12 of 92 slices shown, 14 images]
[im 8/92  soft-tissue]
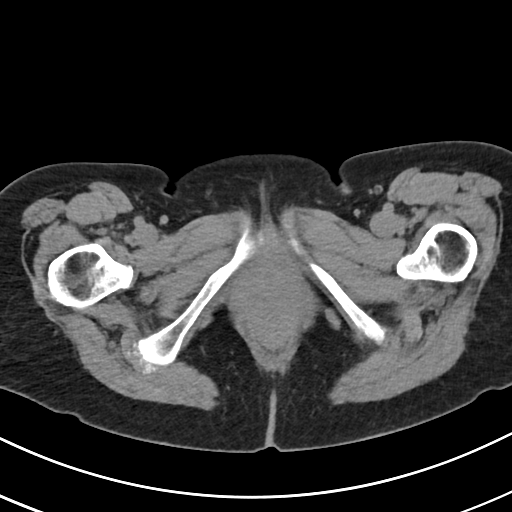
[im 8/92  bone]
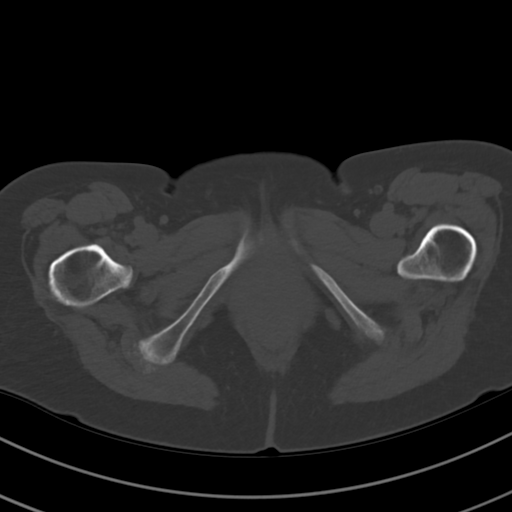
[im 15/92  soft-tissue]
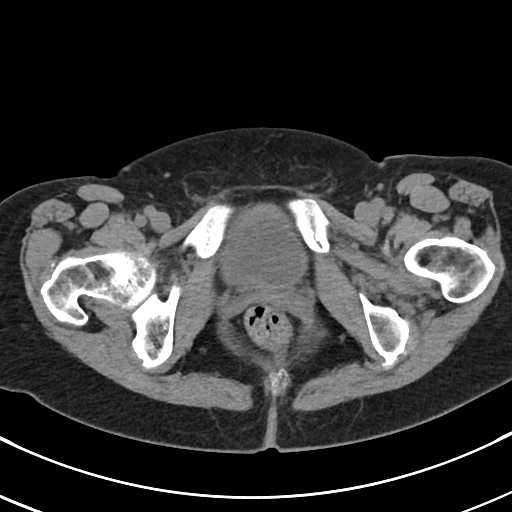
[im 22/92  soft-tissue]
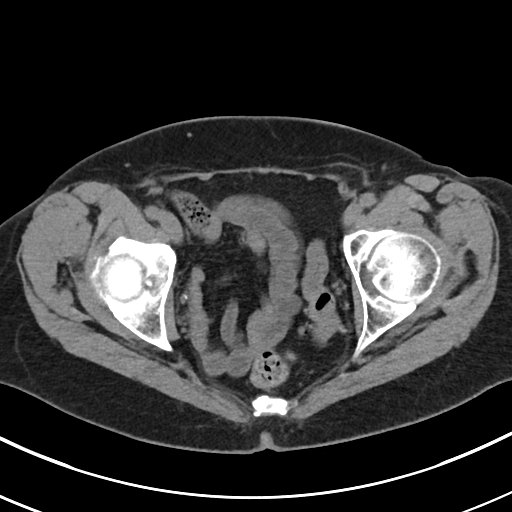
[im 30/92  soft-tissue]
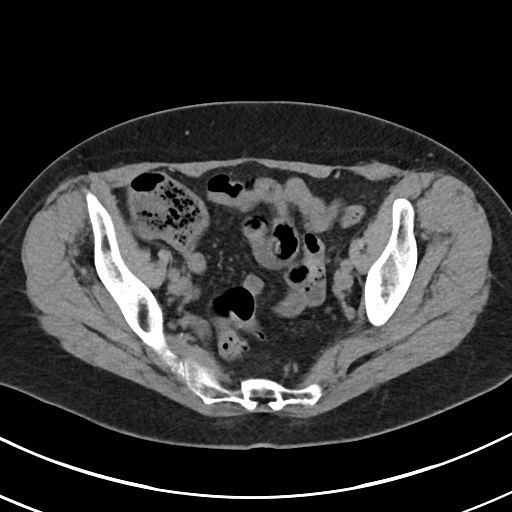
[im 37/92  soft-tissue]
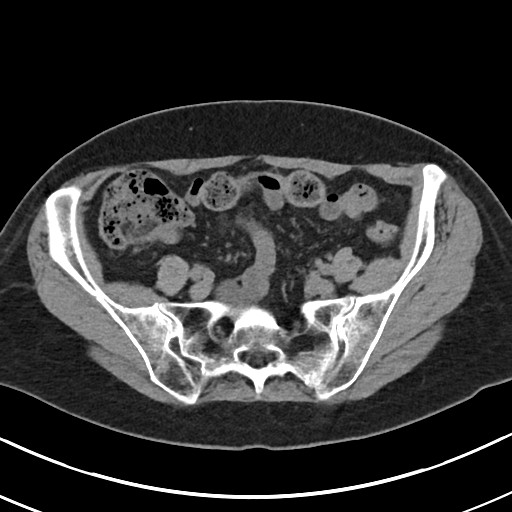
[im 44/92  soft-tissue]
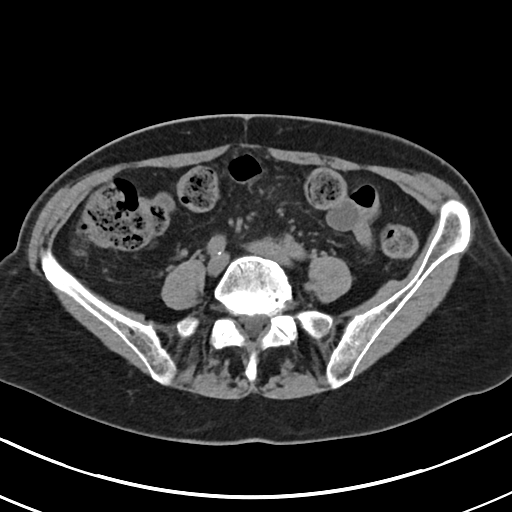
[im 51/92  soft-tissue]
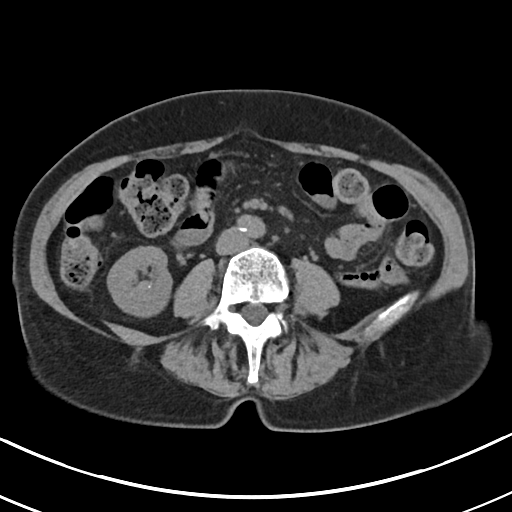
[im 59/92  soft-tissue]
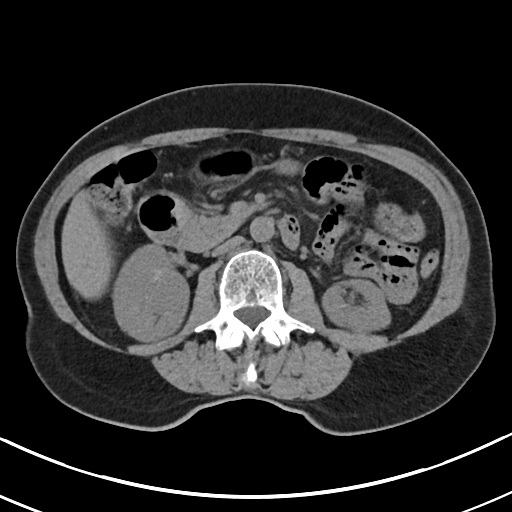
[im 66/92  soft-tissue]
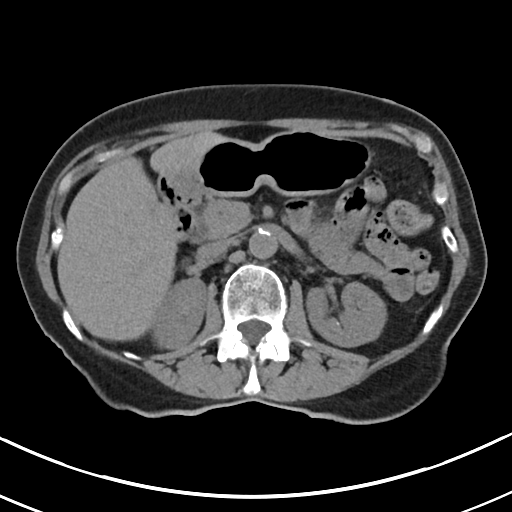
[im 66/92  bone]
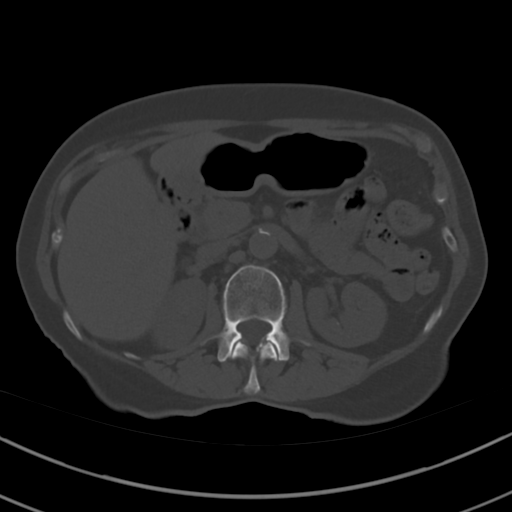
[im 73/92  soft-tissue]
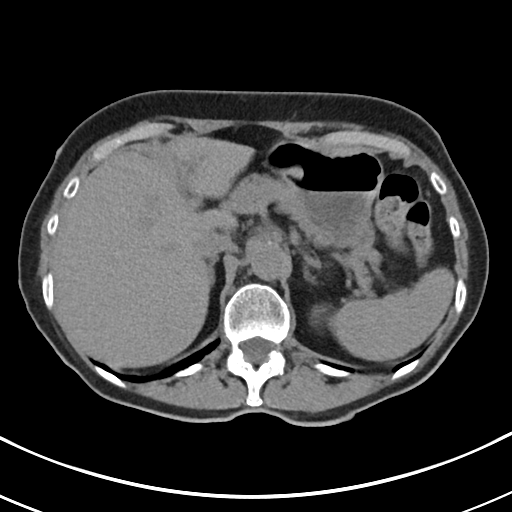
[im 81/92  soft-tissue]
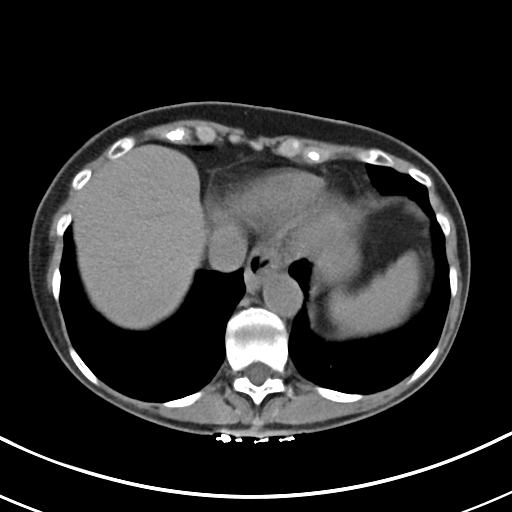
[im 88/92  soft-tissue]
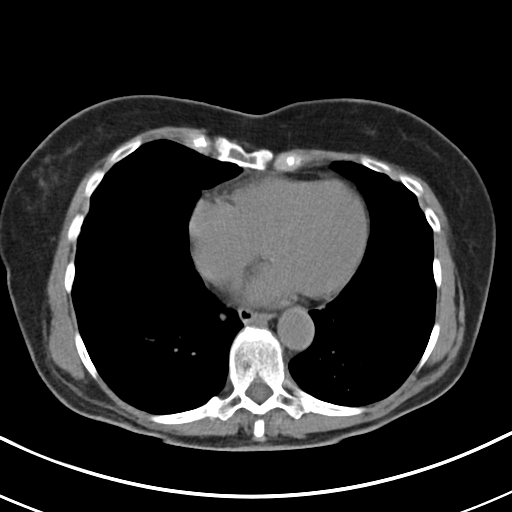

[Series 4: renal stone 2.00 cor · coronal · 0.65mm/px · 3 of 124 slices shown]
[im 42/124  soft-tissue]
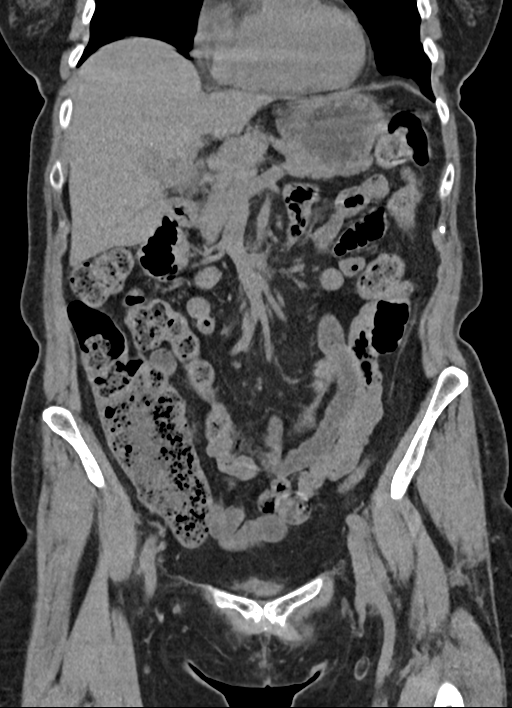
[im 55/124  soft-tissue]
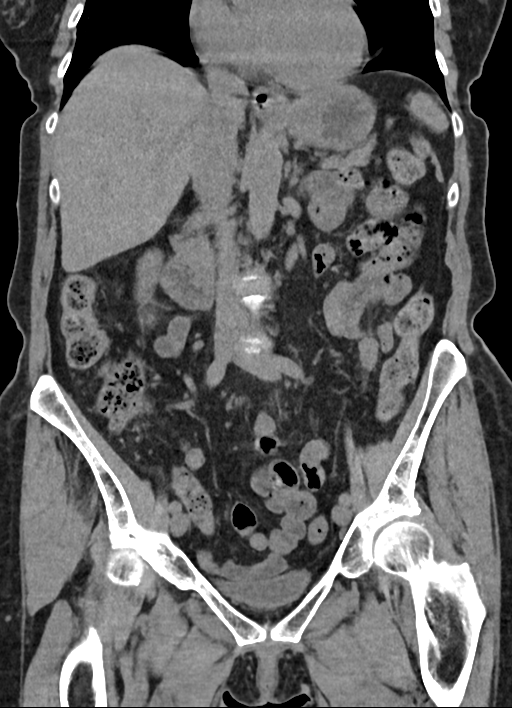
[im 69/124  soft-tissue]
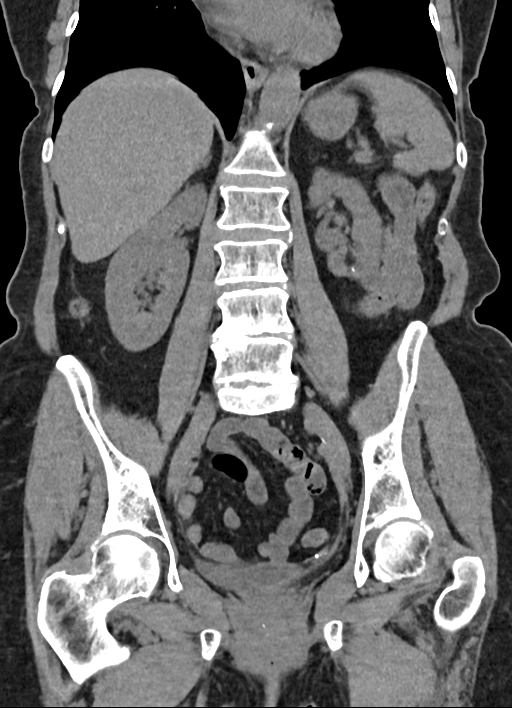

[15 of 46 positions shown; findings below may reference images not displayed]

FINDINGS: Lower chest: No acute abnormality. Suggestion of a tiny hiatal
hernia.

Hepatobiliary: No focal liver abnormality is seen. Status post
cholecystectomy. No biliary dilatation.

Pancreas: No focal lesion. Normal pancreatic contour. No surrounding
inflammatory changes. No main pancreatic ductal dilatation.

Spleen: Normal in size without focal abnormality.

Adrenals/Urinary Tract: No adrenal nodule bilaterally. Bilateral
calcified stones measuring up to 5 mm on the left and 4 mm on the
right on axial imaging. No hydronephrosis and no contour-deforming
renal mass. No ureterolithiasis or hydroureter. The urinary bladder
is unremarkable.

Stomach/Bowel: Stomach is within normal limits. No evidence of bowel
wall thickening or dilatation. Scattered sigmoid diverticulosis. The
appendix not definitely identified.

Vascular/Lymphatic: No abdominal aorta or iliac aneurysm. Mild
atherosclerotic plaque of the aorta and its branches. No abdominal,
pelvic, or inguinal lymphadenopathy.

Reproductive: Status post hysterectomy. No adnexal masses.

Other: No intraperitoneal free fluid. No intraperitoneal free gas.
No organized fluid collection.

Musculoskeletal:

No abdominal wall hernia or abnormality.

No suspicious lytic or blastic osseous lesions. No acute displaced
fracture. Multilevel lower lumbar degenerative changes of the spine
with intervertebral disc space vacuum phenomenon and endplate
sclerosis at the L4-L5 and L5-S1 level.
IMPRESSION: 1. Nonobstructive bilateral nephrolithiasis measuring up to 5 mm on
the left and 4mm on the right.
2. Scattered sigmoid diverticulosis with no acute diverticulitis.
3. Tiny hiatal hernia.
4.  Aortic Atherosclerosis ([id number]).

## 2021-06-01 ENCOUNTER — Ambulatory Visit: Payer: Medicare Other | Admitting: Urology

## 2021-06-02 ENCOUNTER — Ambulatory Visit
Admission: RE | Admit: 2021-06-02 | Discharge: 2021-06-02 | Disposition: A | Discharge status: Home / Self Care | Payer: Medicare Other | Attending: Urology | Admitting: Urology

## 2021-06-02 ENCOUNTER — Ambulatory Visit
Admission: RE | Admit: 2021-06-02 | Discharge: 2021-06-02 | Disposition: A | Discharge status: Home / Self Care | Payer: Medicare Other | Source: Ambulatory Visit | Attending: Urology | Admitting: Urology

## 2021-06-02 DIAGNOSIS — N200 Calculus of kidney: Secondary | ICD-10-CM

## 2021-06-03 ENCOUNTER — Ambulatory Visit (INDEPENDENT_AMBULATORY_CARE_PROVIDER_SITE_OTHER): Payer: Medicare Other | Admitting: Urology

## 2021-06-03 ENCOUNTER — Encounter: Payer: Self-pay | Admitting: Urology

## 2021-06-03 ENCOUNTER — Other Ambulatory Visit: Payer: Self-pay

## 2021-06-03 VITALS — BP 135/75 | HR 88 | Ht 67.0 in | Wt 140.0 lb

## 2021-06-03 DIAGNOSIS — N200 Calculus of kidney: Secondary | ICD-10-CM | POA: Diagnosis not present

## 2021-06-03 DIAGNOSIS — N3942 Incontinence without sensory awareness: Secondary | ICD-10-CM | POA: Diagnosis not present

## 2021-06-03 DIAGNOSIS — R3129 Other microscopic hematuria: Secondary | ICD-10-CM

## 2021-06-03 NOTE — Progress Notes (Signed)
06/03/2021 9:57 AM   Virginia Silva 06/15/1935 [id number]  Referring provider: Idelle Crouch, MD [facility] [place],  [place] [address]  Chief Complaint  Patient presents with   Nephrolithiasis    HPI: 86 y.o. female presents for annual follow-up.  At her visit last year she was given a trial of Myrbetriq for her incontinence She after a 1 month trial and indicated the medication had helped however was having UTI symptoms PA visit 06/30/2020 with complaints of lower abdominal discomfort.  Indicated at that visit that her incontinence had significant improved on Myrbetriq however was not covered by her insurance Urinalysis showed >30 RBCs and trace leukocytes Noncontrast CT was ordered which showed bilateral, nonobstructing renal calculi Urine culture grew mixed flora and cystoscopy was recommended however patient declined excepting the risk of missing significant lower tract abnormalities Repeat urinalysis 03/2021 with PCP showed 4-10 WBC and 0-3 RBC with calcium oxalate crystals She states she was given another medication for incontinence and had hives and itching however I do not see where she was provided any additional medication by our office or any other offices and no documentation in allergies regarding hives and itching She states her incontinence has been much better over the last 2-3 months Denies flank, abdominal or pelvic pain Denies gross hematuria KUB today shows stable, bilateral renal calculi   PMH: Past Medical History:  Diagnosis Date   Allergic rhinitis due to allergen    Cervicalgia    Esophageal spasm    Fibrocystic breast disease    Hiatal hernia    Hypercalcemia    Hyperparathyroidism ([place])    Lung cancer ([place]) 02/14/2013   Partial RML Lung Resection.   Mitral valvular prolapse    Osteoarthritis    Palpitations    Rosacea    Senile osteoporosis    Vitamin D deficiency     Surgical History: Past Surgical  History:  Procedure Laterality Date   ABDOMINAL HYSTERECTOMY     CHOLECYSTECTOMY     OOPHORECTOMY     PARATHYROIDECTOMY  07/09/2015   THORACOSCOPY WITH WEDGE RESECTION LUNG      Home Medications:  Allergies as of 06/03/2021       Reactions   Chloraprep One Step [chlorhexidine Gluconate] Rash   Erythromycin Rash   Sulfa Antibiotics Rash        Medication List        Accurate as of June 03, 2021  9:57 AM. If you have any questions, ask your nurse or doctor.          amLODipine 5 MG tablet Commonly known as: NORVASC Take 5 mg by mouth daily.   calcium carbonate 750 MG chewable tablet Commonly known as: TUMS EX Chew by mouth.   cyanocobalamin 1000 MCG tablet Take by mouth.   methimazole 5 MG tablet Commonly known as: TAPAZOLE Take 2.5 mg by mouth daily.   Vitamin D 50 MCG (2000 UT) tablet Take 2,000 Units by mouth daily.        Allergies:  Allergies  Allergen Reactions   Chloraprep One Step [Chlorhexidine Gluconate] Rash   Erythromycin Rash   Sulfa Antibiotics Rash    Family History: No family history on file.  Social History:  reports that she has never smoked. She has never used smokeless tobacco. She reports that she does not drink alcohol and does not use drugs.   Physical Exam: BP 135/75    Pulse 88    Ht 5\' 7"  (  1.702 m)    Wt 140 lb (63.5 kg)    BMI 21.93 kg/m   Constitutional:  Alert and oriented, No acute distress. HEENT: [place] AT, moist mucus membranes.  Trachea midline, no masses. Cardiovascular: No clubbing, cyanosis, or edema. Respiratory: Normal respiratory effort, no increased work of breathing.    Pertinent Imaging: KUB was personally reviewed and interpreted.  Radiology mentions a 3 mm calcification in the mid ureter.  There is a small calcification overlying the left transverse process in the region of the lower proximal ureter.  This could potentially be a small ureteral calculus.  Patient is asymptomatic  Abdomen 1 view  (KUB)  Narrative CLINICAL DATA:  History of kidney stones  EXAM: ABDOMEN - 1 VIEW  COMPARISON:  06/05/2020  FINDINGS: Scattered large and small bowel gas is noted. Multiple calcifications are identified over the kidneys bilaterally relatively stable from the prior exam. The overall appearance is stable. The largest of these measures 5 mm in the left lower pole. A small 3 mm calcification is noted in the expected region of the left mid ureter. Degenerative changes of lumbar spine are noted.  IMPRESSION: Stable bilateral renal calculi.  3 mm calcification in the expected region of the left ureter. CT urography may be helpful for further evaluation.   Electronically Signed By: Inez Catalina M.D. On: 06/02/2021 23:59    Assessment & Plan:    1.  Nephrolithiasis Stable, bilateral renal calculi Possible left proximal ureteral calculus Patient is asymptomatic-follow-up KUB 6 weeks; earlier for stone symptoms  2.  Urinary incontinence Improved  3.  Microhematuria Declined cystoscopy Repeat urinalysis at time of [place], MD  [place] [address] [place], [place] [address] [phone]

## 2021-06-04 ENCOUNTER — Encounter: Payer: Self-pay | Admitting: Urology

## 2021-06-04 ENCOUNTER — Telehealth: Payer: Self-pay | Admitting: *Deleted

## 2021-06-04 DIAGNOSIS — N200 Calculus of kidney: Secondary | ICD-10-CM

## 2021-06-04 NOTE — Telephone Encounter (Signed)
-----   Message from Abbie Sons, MD sent at 06/04/2021 10:33 AM EST ----- Regarding: KUB Radiology report on KUB felt there may be a small left ureteral calculus.  We will schedule lab visit for urinalysis and a KUB in 8 weeks

## 2021-06-04 NOTE — Telephone Encounter (Signed)
Notified patient as instructed, patient pleased. Discussed follow-up appointments, patient agrees. Appt made

## 2021-06-08 ENCOUNTER — Ambulatory Visit: Payer: Self-pay | Admitting: Urology

## 2021-07-07 ENCOUNTER — Other Ambulatory Visit: Payer: Self-pay | Admitting: *Deleted

## 2021-07-07 DIAGNOSIS — N3942 Incontinence without sensory awareness: Secondary | ICD-10-CM

## 2021-07-07 DIAGNOSIS — N200 Calculus of kidney: Secondary | ICD-10-CM

## 2021-07-31 ENCOUNTER — Other Ambulatory Visit: Payer: Medicare Other

## 2021-07-31 ENCOUNTER — Ambulatory Visit
Admission: RE | Admit: 2021-07-31 | Discharge: 2021-07-31 | Disposition: A | Discharge status: Home / Self Care | Payer: Medicare Other | Source: Ambulatory Visit | Attending: Urology | Admitting: Urology

## 2021-07-31 ENCOUNTER — Other Ambulatory Visit: Payer: Self-pay

## 2021-07-31 DIAGNOSIS — N200 Calculus of kidney: Secondary | ICD-10-CM | POA: Insufficient documentation

## 2021-07-31 DIAGNOSIS — N3942 Incontinence without sensory awareness: Secondary | ICD-10-CM

## 2021-07-31 LAB — URINALYSIS, COMPLETE
Bilirubin, UA: NEGATIVE
Glucose, UA: NEGATIVE
Ketones, UA: NEGATIVE
Nitrite, UA: NEGATIVE
Protein,UA: NEGATIVE
RBC, UA: NEGATIVE
Specific Gravity, UA: >1.03 — ABNORMAL HIGH (ref 1.005–1.030)
Urobilinogen, Ur: 0.2 mg/dL (ref 0.2–1.0)
pH, UA: 6 (ref 5.0–7.5)

## 2021-07-31 LAB — MICROSCOPIC EXAMINATION
Bacteria, UA: NONE SEEN
Epithelial Cells (non renal): NONE SEEN /hpf (ref 0–10)

## 2021-08-06 ENCOUNTER — Encounter: Payer: Self-pay | Admitting: Urology

## 2022-03-19 ENCOUNTER — Other Ambulatory Visit: Payer: Self-pay

## 2022-03-19 DIAGNOSIS — M7989 Other specified soft tissue disorders: Secondary | ICD-10-CM

## 2022-03-26 ENCOUNTER — Ambulatory Visit
Admission: RE | Admit: 2022-03-26 | Discharge: 2022-03-26 | Disposition: A | Discharge status: Home / Self Care | Payer: Medicare Other | Source: Ambulatory Visit | Attending: Internal Medicine | Admitting: Internal Medicine

## 2022-03-26 DIAGNOSIS — M7989 Other specified soft tissue disorders: Secondary | ICD-10-CM | POA: Insufficient documentation

## 2022-04-30 ENCOUNTER — Other Ambulatory Visit: Payer: Self-pay | Admitting: Internal Medicine

## 2022-04-30 DIAGNOSIS — Z1231 Encounter for screening mammogram for malignant neoplasm of breast: Secondary | ICD-10-CM

## 2022-06-02 ENCOUNTER — Ambulatory Visit (INDEPENDENT_AMBULATORY_CARE_PROVIDER_SITE_OTHER): Payer: Medicare Other | Admitting: Urology

## 2022-06-02 ENCOUNTER — Encounter: Payer: Self-pay | Admitting: Urology

## 2022-06-02 ENCOUNTER — Ambulatory Visit
Admission: RE | Admit: 2022-06-02 | Discharge: 2022-06-02 | Disposition: A | Discharge status: Home / Self Care | Payer: Medicare Other | Source: Ambulatory Visit | Attending: Urology | Admitting: Urology

## 2022-06-02 VITALS — BP 127/72 | HR 88 | Ht 67.0 in | Wt 144.0 lb

## 2022-06-02 DIAGNOSIS — N200 Calculus of kidney: Secondary | ICD-10-CM | POA: Diagnosis not present

## 2022-06-02 DIAGNOSIS — N3281 Overactive bladder: Secondary | ICD-10-CM

## 2022-06-02 DIAGNOSIS — Z87440 Personal history of urinary (tract) infections: Secondary | ICD-10-CM | POA: Diagnosis not present

## 2022-06-02 NOTE — Progress Notes (Signed)
   06/02/2022 9:54 AM   [name] Nov 01, 1935 [id number]  Referring provider: Idelle Crouch, MD [facility] [address] [place],  [place] [address]  Chief Complaint  Patient presents with   Nephrolithiasis    HPI: 87 y.o. female presents for annual follow-up.  Doing well since last visit Stable OAB symptoms which have decreased with limiting caffeine Treated for E. coli UTI admitted December and presently asymptomatic Denies dysuria, gross hematuria Denies flank, abdominal or pelvic pain   PMH: Past Medical History:  Diagnosis Date   Allergic rhinitis due to allergen    Cervicalgia    Esophageal spasm    Fibrocystic breast disease    Hiatal hernia    Hypercalcemia    Hyperparathyroidism ([place])    Lung cancer ([place]) 02/14/2013   Partial RML Lung Resection.   Mitral valvular prolapse    Osteoarthritis    Palpitations    Rosacea    Senile osteoporosis    Vitamin D deficiency     Surgical History: Past Surgical History:  Procedure Laterality Date   ABDOMINAL HYSTERECTOMY     CHOLECYSTECTOMY     OOPHORECTOMY     PARATHYROIDECTOMY  07/09/2015   THORACOSCOPY WITH WEDGE RESECTION LUNG      Home Medications:  Allergies as of 06/02/2022       Reactions   Chloraprep One Step [chlorhexidine Gluconate] Rash   Erythromycin Rash   Sulfa Antibiotics Rash        Medication List        Accurate as of June 02, 2022  9:54 AM. If you have any questions, ask your nurse or doctor.          amLODipine 5 MG tablet Commonly known as: NORVASC Take 5 mg by mouth daily.   calcium carbonate 750 MG chewable tablet Commonly known as: TUMS EX Chew by mouth.   cyanocobalamin 1000 MCG tablet Take by mouth.   losartan-hydrochlorothiazide 50-12.5 MG tablet Commonly known as: HYZAAR Take 1 tablet by mouth daily.   methimazole 5 MG tablet Commonly known as: TAPAZOLE Take 2.5 mg by mouth daily.   Vitamin D 50 MCG (2000 UT) tablet Take 2,000  Units by mouth daily.        Allergies:  Allergies  Allergen Reactions   Chloraprep One Step [Chlorhexidine Gluconate] Rash   Erythromycin Rash   Sulfa Antibiotics Rash    Family History: History reviewed. No pertinent family history.  Social History:  reports that she has never smoked. She has never used smokeless tobacco. She reports that she does not drink alcohol and does not use drugs.   Physical Exam: BP 127/72   Pulse 88   Ht 5\' 7"  (1.702 m)   Wt 144 lb (65.3 kg)   BMI 22.55 kg/m   Constitutional:  Alert and oriented, No acute distress. HEENT: [place] AT, moist mucus membranes.  Trachea midline, no masses. Cardiovascular: No clubbing, cyanosis, or edema. Respiratory: Normal respiratory effort, no increased work of breathing.    Pertinent Imaging: KUB performed today was personally reviewed and interpreted.  Stable, bilateral renal calculi    Assessment & Plan:    1.  Nephrolithiasis Stable, bilateral renal calculi Continue annual follow-up with KUB  2.  Recent UTI UA ordered  3.  Overactive bladder Improved symptoms with decreasing caffeine    Abbie Sons, MD  [facility] [address], [place], [place] [address] [phone]

## 2023-01-28 ENCOUNTER — Ambulatory Visit: Payer: Medicare Other

## 2023-01-28 DIAGNOSIS — Z719 Counseling, unspecified: Secondary | ICD-10-CM

## 2023-01-28 DIAGNOSIS — Z23 Encounter for immunization: Secondary | ICD-10-CM | POA: Diagnosis not present

## 2023-01-28 NOTE — Progress Notes (Signed)
Pt seen in clinic requested Moderna covid vaccine. Eligible per [facility], administered SpikeVax 12y+, yr 2024-2025, monitored for 15 minutes without problems. Given VIS and [facility] copies, explained and understood. M.Renly Roots, LPN.

## 2023-05-06 ENCOUNTER — Other Ambulatory Visit: Payer: Self-pay | Admitting: Internal Medicine

## 2023-05-06 DIAGNOSIS — Z1231 Encounter for screening mammogram for malignant neoplasm of breast: Secondary | ICD-10-CM

## 2023-05-30 ENCOUNTER — Other Ambulatory Visit: Payer: Self-pay | Admitting: *Deleted

## 2023-05-30 DIAGNOSIS — N200 Calculus of kidney: Secondary | ICD-10-CM

## 2023-06-03 ENCOUNTER — Ambulatory Visit
Admission: RE | Admit: 2023-06-03 | Discharge: 2023-06-03 | Disposition: A | Discharge status: Home / Self Care | Payer: Medicare Other | Source: Ambulatory Visit | Attending: Urology | Admitting: Urology

## 2023-06-03 ENCOUNTER — Encounter: Payer: Self-pay | Admitting: Urology

## 2023-06-03 ENCOUNTER — Inpatient Hospital Stay: Admit: 2023-06-03 | Payer: Medicare Other

## 2023-06-03 ENCOUNTER — Ambulatory Visit (INDEPENDENT_AMBULATORY_CARE_PROVIDER_SITE_OTHER): Payer: Medicare Other | Admitting: Urology

## 2023-06-03 VITALS — BP 152/73 | HR 82 | Ht 67.0 in | Wt 145.4 lb

## 2023-06-03 DIAGNOSIS — N200 Calculus of kidney: Secondary | ICD-10-CM

## 2023-06-03 DIAGNOSIS — R8281 Pyuria: Secondary | ICD-10-CM

## 2023-06-03 DIAGNOSIS — N3281 Overactive bladder: Secondary | ICD-10-CM

## 2023-06-03 LAB — URINALYSIS, COMPLETE
Bilirubin, UA: NEGATIVE
Glucose, UA: NEGATIVE
Ketones, UA: NEGATIVE
Nitrite, UA: NEGATIVE
Protein,UA: NEGATIVE
Specific Gravity, UA: 1.025 (ref 1.005–1.030)
Urobilinogen, Ur: 0.2 mg/dL (ref 0.2–1.0)
pH, UA: 6 (ref 5.0–7.5)

## 2023-06-03 LAB — MICROSCOPIC EXAMINATION

## 2023-06-03 NOTE — Progress Notes (Signed)
I, Maysun Anabel Bene, acting as a scribe for Riki Altes, MD., have documented all relevant documentation on the behalf of Riki Altes, MD, as directed by Riki Altes, MD while in the presence of Riki Altes, MD.  06/03/2023 10:13 AM   [name] [birth year] [id number]  Referring provider: Marguarite Arbour, MD [address] [facility] [place],  [place] [address]  Chief Complaint  Patient presents with   Nephrolithiasis   Urologic history 1.  Nephrolithiasis   2.  Recent UTI  3.  Overactive bladder  HPI: Virginia Silva is a 88 y.o. female presents for annual follow-up.   No significant changes since last year's visit.  She did have a urinalysis at [facility] last month, which showed significant pyuria at 69 WBCs and no epithelial cells. The urine culture grew mixed flora. Denies dysuria, gross hematuria Denies flank, abdominal or pelvic pain   PMH: Past Medical History:  Diagnosis Date   Allergic rhinitis due to allergen    Cervicalgia    Esophageal spasm    Fibrocystic breast disease    Hiatal hernia    Hypercalcemia    Hyperparathyroidism (HCC)    Kidney stone    Lung cancer (HCC) 02/14/2013   Partial RML Lung Resection.   Mitral valvular prolapse    Osteoarthritis    Palpitations    Rosacea    Senile osteoporosis    Vitamin D deficiency     Surgical History: Past Surgical History:  Procedure Laterality Date   ABDOMINAL HYSTERECTOMY     CHOLECYSTECTOMY     OOPHORECTOMY     PARATHYROIDECTOMY  07/09/2015   THORACOSCOPY WITH WEDGE RESECTION LUNG      Home Medications:  Allergies as of 06/03/2023       Reactions   Chloraprep One Step [chlorhexidine Gluconate] Rash   Erythromycin Rash   Sulfa Antibiotics Rash        Medication List        Accurate as of June 03, 2023 10:13 AM. If you have any questions, ask your nurse or doctor.          STOP taking these medications    amLODipine 5 MG  tablet Commonly known as: NORVASC Stopped by: Riki Altes   losartan-hydrochlorothiazide 50-12.5 MG tablet Commonly known as: HYZAAR Stopped by: Riki Altes       TAKE these medications    calcium carbonate 750 MG chewable tablet Commonly known as: TUMS EX Chew by mouth.   cyanocobalamin 1000 MCG tablet Take by mouth.   losartan 50 MG tablet Commonly known as: COZAAR Take 50 mg by mouth daily.   methimazole 5 MG tablet Commonly known as: TAPAZOLE Take 2.5 mg by mouth daily.   Vitamin D 50 MCG (2000 UT) tablet Take 2,000 Units by mouth daily.        Allergies:  Allergies  Allergen Reactions   Chloraprep One Step [Chlorhexidine Gluconate] Rash   Erythromycin Rash   Sulfa Antibiotics Rash    Social History:  reports that she has never smoked. She has never used smokeless tobacco. She reports that she does not drink alcohol and does not use drugs.   Physical Exam: BP (!) 152/73 (BP Location: Left Arm, Patient Position: Sitting, Cuff Size: Normal)   Pulse 82   Ht 5\' 7"  (1.702 m)   Wt 145 lb 6.4 oz (66 kg)   BMI 22.77 kg/m   Constitutional:  Alert and  oriented, No acute distress. HEENT: [place] AT Respiratory: Normal respiratory effort, no increased work of breathing. Psychiatric: Normal mood and affect.   Pertinent Imaging: KUB performed earlier today was personally reviewed and interpreted. Stable small calcifications overlying the renal outlines bilaterally. The x-ray has not yet been read by radiology.    Assessment & Plan:    1. Bilateral nephrolithiasis Stable  2. Pyuria Recent UA with significant pyuria and negative urine culture.  Will repeat UA today and if persistent pyuria schedule cystoscopy.   3. Overactive bladder Has failed medical management and currently using pads. She has seen Dr. Sherron Monday in the past.   I have reviewed the above documentation for accuracy and completeness, and I agree with the above.   Riki Altes,  MD  [facility], [facility] [address], [address] [place], [place] [address] [phone]

## 2023-06-06 ENCOUNTER — Other Ambulatory Visit: Payer: Self-pay | Admitting: *Deleted

## 2023-06-06 ENCOUNTER — Encounter: Payer: Self-pay | Admitting: *Deleted

## 2023-06-06 DIAGNOSIS — R310 Gross hematuria: Secondary | ICD-10-CM

## 2023-06-16 ENCOUNTER — Ambulatory Visit
Admission: RE | Admit: 2023-06-16 | Discharge: 2023-06-16 | Disposition: A | Discharge status: Home / Self Care | Payer: Medicare Other | Source: Ambulatory Visit | Attending: Urology | Admitting: Urology

## 2023-06-16 DIAGNOSIS — R310 Gross hematuria: Secondary | ICD-10-CM | POA: Diagnosis present

## 2023-06-16 MED ORDER — IOHEXOL 300 MG/ML  SOLN
100.0000 mL | Freq: Once | INTRAMUSCULAR | Status: AC | PRN
  Administered 2023-06-16: 100 mL via INTRAVENOUS

## 2023-07-08 ENCOUNTER — Ambulatory Visit (INDEPENDENT_AMBULATORY_CARE_PROVIDER_SITE_OTHER): Payer: Medicare Other | Admitting: Urology

## 2023-07-08 VITALS — BP 149/81 | HR 85 | Ht 68.0 in | Wt 143.0 lb

## 2023-07-08 DIAGNOSIS — N200 Calculus of kidney: Secondary | ICD-10-CM | POA: Diagnosis not present

## 2023-07-08 DIAGNOSIS — R8281 Pyuria: Secondary | ICD-10-CM

## 2023-07-08 DIAGNOSIS — R310 Gross hematuria: Secondary | ICD-10-CM

## 2023-07-08 LAB — URINALYSIS, COMPLETE
Bilirubin, UA: NEGATIVE
Glucose, UA: NEGATIVE
Ketones, UA: NEGATIVE
Nitrite, UA: NEGATIVE
Protein,UA: NEGATIVE
RBC, UA: NEGATIVE
Specific Gravity, UA: 1.02 (ref 1.005–1.030)
Urobilinogen, Ur: 0.2 mg/dL (ref 0.2–1.0)
pH, UA: 5.5 (ref 5.0–7.5)

## 2023-07-08 LAB — MICROSCOPIC EXAMINATION: Bacteria, UA: NONE SEEN

## 2023-07-08 NOTE — Progress Notes (Signed)
   07/08/23  CC:  Chief Complaint  Patient presents with   Cysto    HPI: 88 y.o. female with sterile pyuria.  Refer to my office note 06/03/2023.  She has no complaints today.  CTU performed 06/16/2023 showed multiple bilateral nonobstructing renal calculi.  No ureteral calculi or hydronephrosis identified.  Bosniak 2 cyst of the right kidney noted.  UA today much improved at 6-10 WBC  Blood pressure (!) 149/81, pulse 85, height 5\' 8"  (1.727 m), weight 143 lb (64.9 kg). NED. A&Ox3.   No respiratory distress   Abd soft, NT, ND Normal external genitalia with patent urethral meatus  Cystoscopy Procedure Note  Patient identification was confirmed, informed consent was obtained, and patient was prepped using Betadine solution.  Lidocaine jelly was administered per urethral meatus.    Procedure: - Flexible cystoscope introduced, without any difficulty.   - Thorough search of the bladder revealed:    normal urethral meatus    normal urothelium    no stones    no ulcers     no tumors    no urethral polyps    no trabeculation  - Ureteral orifices were normal in position and appearance.  Post-Procedure: - Patient tolerated the procedure well  Assessment/ Plan: No bladder mucosal abnormalities Bilateral nonobstructing renal calculi 1 year follow-up with KUB    Riki Altes, MD

## 2023-08-17 NOTE — Progress Notes (Signed)
 History of Present Illness:   Virginia Silva is a 88 y.o. female here for acute onset of erythema, swelling, and pain over the dorsal surface of the second MCP joint of the right hand.  She denies any specific injury.  She does have history of osteoarthritis.  She denies any history of gout.  She is right-hand dominant.  He describes the pain as burning and stinging.  She has tried ibuprofen, Aleve, and Voltaren gel.   Past Medical History:   Past Medical History:  Diagnosis Date  . Allergic rhinitis due to allergen   . Allergy    Bee stings, Chloraprep, Erythromycin; Sulfa Antibiotics  . Asthma without status asthmaticus (HHS-HCC)   . Carcinoid tumor of lung (CMS-HCC)   . Cervicalgia   . Degenerative disc disease   . Esophageal spasm   . Fibrocystic breast disease   . Hiatal hernia   . HTN, goal below 140/80 07/13/2018  . Mitral valvular prolapse   . Osteoarthritis   . Osteoporosis    (GWK) a. Mild hypercalcemia/ hypercalcuria  b. Fosamax  . Palpitations   . Rosacea   . Senile osteoporosis   . Syncope and collapse   . Thyroid disease     Past Surgical History:   Past Surgical History:  Procedure Laterality Date  . THORACOSCOPY WITH WEDGE RESECTION LUNG Right 2014   RML carcinoid excision  . CHOLECYSTECTOMY  07/17/2012  . PARATHYROIDECTOMY N/A 07/09/2015   Procedure: (RCC) PARATHYROIDECTOMY ;  Surgeon: Sheena Deward Breeze, MD;  Location[facility];  Service: General Surgery;  Laterality: N/A;  . MONITORING CRANIAL NERVES UNILATERAL N/A 07/09/2015   Procedure: MONITORING CRANIAL NERVES UNILATERAL;  Surgeon: Sheena Deward Breeze, MD;  Location: [facility];  Service: General Surgery;  Laterality: N/A;  . CATARACT EXTRACTION Right 03/22/2016  . CATARACT EXTRACTION Left 04/05/2016  . HYSTERECTOMY VAGINAL    . OOPHORECTOMY      Allergies:   Allergies  Allergen Reactions  . Chlorhexidin-Isopropyl Alcohol Rash    Significant red, pruritic rash after parathyroid surgery with  Chloraprep  . Myrbetriq [Mirabegron] Rash  . Chlorhexidine Gluconate Rash  . Erythromycin Rash  . Ilosone [Erythromycin Estolate] Rash  . Other Rash    ADHESIVE BANDAGE  . Sulfa (Sulfonamide Antibiotics) Rash    Current Medications:   Prior to Admission medications   Medication Sig Taking? Last Dose  cholecalciferol (VITAMIN D3) 2,000 unit tablet Take 2,000 Units by mouth 2 (two) times daily.   Yes Taking  cyanocobalamin (VITAMIN B12) 1000 MCG tablet Take 1,000 mcg by mouth once daily Yes Taking  FUROsemide (LASIX) 20 MG tablet Take 1 tablet (20 mg total) by mouth once daily Yes Taking  losartan (COZAAR) 50 MG tablet Take 1 tablet (50 mg total) by mouth once daily Yes Taking  methIMAzole (TAPAZOLE) 5 MG tablet Take 1 tablet (5 mg total) by mouth once daily Yes Taking  doxycycline (VIBRAMYCIN) 100 MG capsule Take 1 capsule (100 mg total) by mouth 2 (two) times daily for 7 days    predniSONE (DELTASONE) 20 MG tablet Take 1 tablet (20 mg total) by mouth once daily for 5 days      Family History:   Family History  Problem Relation Name Age of Onset  . Cancer Mother    . Nephrolithiasis Mother    . Heart disease Father    . Pancreatic cancer Brother         bile duct  . No Known Problems Maternal Grandmother    .  Dementia Maternal Grandfather    . No Known Problems Paternal Grandmother    . Myocardial Infarction (Heart attack) Paternal Grandfather    . Anesthesia problems Neg Hx    . Malignant hyperthermia Neg Hx      Social History:   Social History   Socioeconomic History  . Marital status: Married  . Number of children: 0  Occupational History  . 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 Smoking status: Never  . Smokeless tobacco: Never  Vaping Use  . Vaping status: Never Used  Substance and Sexual Activity  . Alcohol use: Yes    Alcohol/week: 1.0 standard drink of alcohol    Types: 1 Glasses of wine per week    Comment: occ  . Drug use: Never  .  Sexual activity: Defer   Social Drivers of Health   Financial Resource Strain: Low Risk  (05/04/2023)   Overall Financial Resource Strain (CARDIA)   . Difficulty of Paying Living Expenses: Not hard at all  Food Insecurity: No Food Insecurity (05/04/2023)   Hunger Vital Sign   . Worried About Programme researcher, broadcasting/film/video in the Last Year: Never true   . Ran Out of Food in the Last Year: Never true  Transportation Needs: No Transportation Needs (05/04/2023)   PRAPARE - Transportation   . Lack of Transportation (Medical): No   . Lack of Transportation (Non-Medical): No  Housing Stability: Low Risk  (06/20/2023)   Housing Stability Vital Sign   . Unable to Pay for Housing in the Last Year: No   . Number of Times Moved in the Last Year: 0   . Homeless in the Last Year: No    Review of Systems:   Review of Systems  Musculoskeletal:  Positive for arthralgias and joint swelling. Negative for gait problem.  Skin:  Positive for color change.  Neurological:  Negative for weakness and numbness.  All other systems reviewed and are negative.   Vitals:   Vitals:   08/17/23 1024  BP: (!) 158/64  Pulse: 73  Temp: 36.5 C (97.7 F)  TempSrc: Oral  SpO2: 96%  Weight: 67.7 kg (149 lb 3.2 oz)  Height: 172.7 cm (5' 8)     Body mass index is 22.69 kg/m.  Physical Exam:   Physical Exam Vitals and nursing note reviewed.  Constitutional:      Appearance: She is well-developed.  HENT:     Head: Atraumatic.  Eyes:     Conjunctiva/sclera: Conjunctivae normal.     Pupils: Pupils are equal, round, and reactive to light.  Cardiovascular:     Rate and Rhythm: Normal rate and regular rhythm.     Heart sounds: Normal heart sounds.  Pulmonary:     Effort: Pulmonary effort is normal.     Breath sounds: Normal breath sounds.  Abdominal:     Comments: Gastrointestinal system examined as above.  Musculoskeletal:     Right hand: Swelling, tenderness and bony tenderness present. Decreased range of  motion. Decreased strength.     Comments: RIGHT hand: Erythema, mild swelling, and tenderness over the second MCP joint of the right hand; decreased range of motion in the index finger, with subsequent decreased ability to oppose index finger with thumb.  Able to oppose all other fingers with thumb.   Skin:    General: Skin is warm and dry.  Neurological:     Mental Status: She is alert and oriented to person, place, and time.  Psychiatric:        Behavior: Behavior normal.     Assessment and Plan:   Results for orders placed or performed in visit on 08/17/23  Basic Metabolic Panel (BMP)  Result Value Ref Range   Glucose 105 70 - 110 mg/dL   Sodium 857 863 - 854 mmol/L   Potassium 4.6 3.6 - 5.1 mmol/L   Chloride 106 97 - 109 mmol/L   Carbon Dioxide (CO2) 34.0 (H) 22.0 - 32.0 mmol/L   Calcium 9.5 8.7 - 10.3 mg/dL   Urea Nitrogen (BUN) 17 7 - 25 mg/dL   Creatinine 0.7 0.6 - 1.1 mg/dL   Glomerular Filtration Rate (eGFR) 84 >60 mL/min/1.73sq m   BUN/Crea Ratio 24.3 (H) 6.0 - 20.0   Anion Gap w/K 6.6 6.0 - 16.0  CBC w/auto Differential (5 Part)  Result Value Ref Range   WBC (White Blood Cell Count) 8.1 4.1 - 10.2 10^3/uL   RBC (Red Blood Cell Count) 4.06 4.04 - 5.48 10^6/uL   Hemoglobin 13.6 12.0 - 15.0 gm/dL   Hematocrit 58.4 64.9 - 47.0 %   MCV (Mean Corpuscular Volume) 102.2 (H) 80.0 - 100.0 fl   MCH (Mean Corpuscular Hemoglobin) 33.5 (H) 27.0 - 31.2 pg   MCHC (Mean Corpuscular Hemoglobin Concentration) 32.8 32.0 - 36.0 gm/dL   Platelet Count 763 849 - 450 10^3/uL   RDW-CV (Red Cell Distribution Width) 12.0 11.6 - 14.8 %   MPV (Mean Platelet Volume) 9.3 (L) 9.4 - 12.4 fl   Neutrophils 5.39 1.50 - 7.80 10^3/uL   Lymphocytes 1.77 1.00 - 3.60 10^3/uL   Monocytes 0.75 0.00 - 1.50 10^3/uL   Eosinophils 0.10 0.00 - 0.55 10^3/uL   Basophils 0.05 0.00 - 0.09 10^3/uL   Neutrophil % 66.8 32.0 - 70.0 %   Lymphocyte % 21.9 10.0 - 50.0 %   Monocyte % 9.3 4.0 - 13.0 %   Eosinophil %  1.2 1.0 - 5.0 %   Basophil% 0.6 0.0 - 2.0 %   Immature Granulocyte % 0.2 <=0.7 %   Immature Granulocyte Count 0.02 <=0.06 10^3/L  Uric Acid  Result Value Ref Range   Uric Acid 4.1 2.3 - 6.6 mg/dL    Diagnoses and all orders for this visit:  Swelling of joint of right hand -     X-ray hand right minimum 3 views -     Basic Metabolic Panel (BMP) -     CBC w/auto Differential (5 Part) -     Uric Acid  Other orders -     doxycycline (VIBRAMYCIN) 100 MG capsule; Take 1 capsule (100 mg total) by mouth 2 (two) times daily for 7 days -     predniSONE (DELTASONE) 20 MG tablet; Take 1 tablet (20 mg total) by mouth once daily for 5 days    Patient Instructions  As we discussed, I did not see any abnormality on your x-rays.  A radiologist will still review your x-rays.  We will call you with these official results once finalized, ONLY IF DIFFERENT FROM MY REVIEW. Otherwise, we will not call you.   Take medications as directed. Return to care should your symptoms not improve, or please present to the nearest Emergency Department should your symptoms change or worsen in any way.    Portions of this note were created using dictation software and may contain typographical errors.   Patient received an After Visit Summary

## 2023-11-02 NOTE — Progress Notes (Signed)
 Virginia Silva is a 88 y.o. here for Medicare Wellness Visit  MEDICARE WELLNESS VISIT  Providers Rendering Care 1. Dr. Reyes Costa (PCP) Pt in NAD. HTN stable on meds. Has thyroid dz on Tapazole, OP with Vitamin D def on po supplement and HLD not on statin.  Also with B12 def on po supplement. Weight stable. Feels OK. Fatigued due to stress. Sleeping OK. No fever or HA's. Had a near syncopal episode 6 weeks ago. No actual syncope. No dizziness. Denies CP or SOB. No change in bowels or bladder.  Functional Assessment (1) Hearing: Demonstrates no difficulty in hearing during normal conversation (2) Risk of Falls: Patient denies any falls or near falls in the last year, Gait steady without assistance during walk from waiting area to exam room (3) Home Safety: Patient feels secure in their home, There are operational smoke alarms in multiple areas of the home (4) Activities of Daily Living: Independently manages personal grooming and household chores, including cooking, cleaning and laundry. Manages Personal finances without assistance.  Depression Screening PHQ 2/9 last 3 flowsheet values     04/29/2022    9:15 AM 05/04/2023    9:27 AM 11/02/2023   10:48 AM  PHQ-2/9 Depression Screening   Little interest or pleasure in doing things  0 0  Feeling down, depressed, or hopeless  0 0  Patient Health Questionnaire-2 Score  0 * 0  (OBSOLETE) Little interest or pleasure in doing things 0    (OBSOLETE) Feeling down, depressed, or hopeless (or irritable for Teens only)? 0    (OBSOLETE) Total Prescreening Score 0    (OBSOLETE) Total Score = 0      * Data saved with a previous flowsheet row definition     Depression Severity and Treatment Recommendations:  0-4= None  5-9= Mild / Treatment: Support, educate to call if worse; return in one month  10-14= Moderate / Treatment: Support, watchful waiting; Antidepressant or Psychotherapy  15-19= Moderately severe / Treatment: Antidepressant OR  Psychotherapy  >= 20 = Major depression, severe / Antidepressant AND Psychotherapy   Cognitive Impairment Patient denies episodes of loosing things, being forgetful. Seems oriented to person, place and time.  Responses appear appropriate and timely to this observer.  PREVENTION PLAN  Cardiovascular: FLP assessed LDL=77 Diabetes: A1c or FBG assessed N/A Glaucoma: N/A Hepatitis B (HBV) Vaccine:  Not Applicable Smoking Cessation:  Not Applicable  Other Personalized Health Advice  Encouraged patient to exercise 5 days a week, walking, water aerobics, gentle stretching recommended. Increase dietary intake of fresh fruits and vegetables, reduce red meat to twice a week.  End of Life Counseling Patient has living will in place; POA - ; Full Code  Current Outpatient Medications  Medication Sig Dispense Refill  . cholecalciferol (VITAMIN D3) 2,000 unit tablet Take 2,000 Units by mouth 2 (two) times daily.      . cyanocobalamin (VITAMIN B12) 1000 MCG tablet Take 1,000 mcg by mouth once daily    . losartan (COZAAR) 50 MG tablet TAKE 1 TABLET BY MOUTH ONCE DAILY 90 tablet 3  . methIMAzole (TAPAZOLE) 5 MG tablet Take 1 tablet (5 mg total) by mouth once daily 90 tablet 4   No current facility-administered medications for this visit.    Allergies as of 11/02/2023 - Reviewed 11/02/2023  Allergen Reaction Noted  . Chlorhexidin-isopropyl alcohol Rash 07/18/2015  . Myrbetriq [mirabegron] Rash 09/18/2020  . Chlorhexidine gluconate Rash 07/24/2015  . Erythromycin Rash 09/06/2013  . Ilosone [erythromycin estolate] Rash 09/06/2013  .  Other Rash 12/10/2013  . Sulfa (sulfonamide antibiotics) Rash 09/06/2013    Patient Active Problem List  Diagnosis  . Carcinoid tumor of right lung (CMS-HCC)  . Age-related osteoporosis without current pathological fracture  . Osteoarthritis  . Asthma without status asthmaticus (HHS-HCC)  . Allergic rhinitis due to allergen  . Palpitations  . Hiatal hernia   . Rosacea  . Fibrocystic breast disease  . Mitral valvular prolapse  . Cervicalgia  . Esophageal spasm  . Hypercalcemia  . Vitamin D deficiency, unspecified  . Multinodular goiter  . Pure hypercholesterolemia  . Abnormal thyroid blood test  . HTN, goal below 140/80  . Nephrolithiasis  . Near syncope  . Hyperthyroidism without crisis    Past Medical History:  Diagnosis Date  . Allergic rhinitis due to allergen   . Allergy    Bee stings, Chloraprep, Erythromycin; Sulfa Antibiotics  . Asthma without status asthmaticus (HHS-HCC)   . Carcinoid tumor of lung (CMS-HCC)   . Cervicalgia   . Degenerative disc disease   . Esophageal spasm   . Fibrocystic breast disease   . Hiatal hernia   . HTN, goal below 140/80 07/13/2018  . Mitral valvular prolapse   . Osteoarthritis   . Osteoporosis    (G[facility]) a. Mild hypercalcemia/ hypercalcuria  b. Fosamax  . Palpitations   . Parathyroid disorder (CMS/HHS-HCC)   . Rosacea   . Senile osteoporosis   . Syncope and collapse   . Thyroid disease     Past Surgical History:  Procedure Laterality Date  . THORACOSCOPY WITH WEDGE RESECTION LUNG Right 2014   RML carcinoid excision  . CHOLECYSTECTOMY  07/17/2012  . PARATHYROIDECTOMY N/A 07/09/2015   Procedure: (RCC) PARATHYROIDECTOMY ;  Surgeon: Sheena Deward Breeze, MD;  [facility];  Service: General Surgery;  Laterality: N/A;  . MONITORING CRANIAL NERVES UNILATERAL N/A 07/09/2015   Procedure: MONITORING CRANIAL NERVES UNILATERAL;  Surgeon: Sheena Deward Breeze, MD;  Location[facility];  Service: General Surgery;  Laterality: N/A;  . CATARACT EXTRACTION Right 03/22/2016  . CATARACT EXTRACTION Left 04/05/2016  . HYSTERECTOMY VAGINAL    . OOPHORECTOMY    . OOPHORECTOMY      Health Maintenance  Topic Date Due  . Sigmoidoscopy  Never done  . Adult Tetanus (Td And Tdap)  Never done  . RSV Immunization Pregnant or 60+ (1 - 1-dose 75+ series) Never done  . Pneumococcal Vaccine: 50+ (2 of 2 -  PPSV23, PCV20, or PCV21) 05/11/2018  . Shingrix (2 of 2) 10/07/2021  . Colonoscopy  01/02/2023  . COVID-19 Vaccine (1 - 2024-25 season) Never done  . Mammogram  10/06/2023  . Influenza Vaccine (Season Ended) 01/16/2024  . Annual Physical/Well Child Check  05/04/2024  . Potassium Level  10/25/2024  . Serum Calcium  10/25/2024  . Depression Screening  11/01/2024  . Medicare Subsequent AWV [id number]  11/02/2024  . DXA Bone Density Scan  04/19/2028  . Lipid Panel  10/25/2028  . Hib Vaccines  Aged Out  . Hepatitis A Vaccines  Aged Out  . Meningococcal B Vaccine  Aged Out  . Meningococcal ACWY Vaccine  Aged Out  . HPV Vaccines  Aged Out    Vitals:   11/02/23 1044  BP: 124/68  Pulse: 82  SpO2: 94%  Weight: 66.3 kg (146 lb 3.2 oz)  Height: 172.7 cm (5' 8)  PainSc: 0-No pain   Body mass index is 22.23 kg/m. General: Alert oriented x3  Eyes: Sclera and conjunctiva clear; pupils equal round  and reactive to light and accommodation; extraocular movements intact Nose: Mucosa healthy without drainage or ulceration Oropharynx: No suspicious lesions Neck: No swelling, masses, stiffness, pain, limited movement, carotid pulses normal bilaterally, thyroid normal size, no masses palpated. No bruits heard. Lungs: Respirations unlabored; clear to auscultation bilaterally Back: No spinal deformity Cardiovascular: Heart regular rate and rhythm without murmurs, gallops, or rubs Abdomen: Soft; non tender; non distended;  no masses or organomegaly Lymph Nodes: No significant cervical, supraclavicular, or axillary lymphadenopathy noted Musculoskeletal: No active joint inflammation Extremities: Normal, no edema Pulses: Dorsalis pedis palpable and symmetric bilaterally Neurologic: Alert and oriented; speech intact; face symmetrical; moves all extremities well    Assessment/Plan  1. Medicare wellness visit- Medications and allergies reviewed. Copy of preventative health provided.  Labs reviewed. 2.  Thyroid dz- stable, same meds 3. HLD- diet/exercise 4. HTN- stable, same meds 5. Near syncope- carotid US  ordered 5a. Anxiety- begin Lexapro 6. RTC 3 mo, sooner if needed Reyes JONETTA Costa, MD, MD  *Some images could not be shown.

## 2023-11-03 ENCOUNTER — Other Ambulatory Visit: Payer: Self-pay | Admitting: Internal Medicine

## 2023-11-03 DIAGNOSIS — Z1231 Encounter for screening mammogram for malignant neoplasm of breast: Secondary | ICD-10-CM

## 2023-11-21 NOTE — Progress Notes (Signed)
 ENCOUNTER: Patient Class :No patient class for patient encounter Department: [facility] CLINIC [address] [place] [address]  PATIENT: Patient Demographics      Name Patient ID SSN Gender Identity Birth Date   Virginia Silva, Virginia Silva [id number] Female [birth year] (88 yrs)          Address Phone Email       [address] [place] [address] [phone] ([email or web address]            [facility] Caucasian/White             Reg Status PCP Date Last Verified Next Review Date     Verified Auston Reyes BIRCH [id number] 11/02/23 12/02/23           Marital Status Religion Language       Married [employer]              EMERGENCY CONTACT: Name Relationship Lgl Grd Work Administrator, sports  1. TIA, HIERONYMUS Spouse   [phone]    GUARANTOR: There is no guarantor information entered for this encounter.  COVERAGE: Primary Visit Coverage      Payer Plan Group Number Group Name Payer Phone Plan Phone   No coverage found                Secondary Visit Coverage      Payer Plan Group Number Group Name Payer Phone Plan Phone   No coverage found                Primary Coverage      Payer Plan Group Number Group Name Payer Phone Plan Phone   [facility] MEDICARE ADVANTAGE PLAN [facility] MEDICARE ADVANTAGE [phone] [facility]             Primary Subscriber      Subscriber ID Subscriber Name Subscriber [facility] Subscriber Address   [id number] ITZELLE, GAINS [id number] [address]      [place], [place] [address]           Secondary Coverage      Payer Plan Group Number Group Name Payer Phone Plan Phone   No coverage found

## 2023-12-13 ENCOUNTER — Encounter (INDEPENDENT_AMBULATORY_CARE_PROVIDER_SITE_OTHER): Payer: Self-pay | Admitting: Vascular Surgery

## 2023-12-13 ENCOUNTER — Ambulatory Visit (INDEPENDENT_AMBULATORY_CARE_PROVIDER_SITE_OTHER): Payer: No Typology Code available for payment source | Admitting: Vascular Surgery

## 2023-12-13 VITALS — BP 108/69 | HR 78 | Resp 18 | Ht 68.0 in | Wt 145.8 lb

## 2023-12-13 DIAGNOSIS — I6529 Occlusion and stenosis of unspecified carotid artery: Secondary | ICD-10-CM | POA: Insufficient documentation

## 2023-12-13 DIAGNOSIS — I10 Essential (primary) hypertension: Secondary | ICD-10-CM | POA: Insufficient documentation

## 2023-12-13 DIAGNOSIS — I6523 Occlusion and stenosis of bilateral carotid arteries: Secondary | ICD-10-CM | POA: Diagnosis not present

## 2023-12-13 NOTE — Assessment & Plan Note (Signed)
 blood pressure control important in reducing the progression of atherosclerotic disease. On appropriate oral medications.

## 2023-12-13 NOTE — Assessment & Plan Note (Signed)
 A carotid duplex was performed which suggested 50 to 69% right ICA stenosis and less than 50% left ICA stenosis.  By our grading criteria this would fall in the 40 to 59% range on the right in the 1 to 39% range on the left.  At this level of stenosis, it is unlikely to be the cause of her presyncopal episodes.  It also does not put her at an increased risk of stroke over the general population.  I would recommend no intervention on this at this time.  Will plan an ultrasound in 3 to 6 months for follow-up.

## 2023-12-13 NOTE — Progress Notes (Signed)
 Patient ID: Virginia Silva, female   DOB: 1936-01-17, 88 y.o.   MRN: [id number]  Chief Complaint  Patient presents with   New Patient (Initial Visit)    Ref [facility] consult occlusion and stenosis of the right carotid artery    HPI Virginia Silva is a 88 y.o. female.  I am asked to see the patient by Dr. Auston for evaluation of carotid stenosis.  The patient has had a few episodes of significant dizziness and presyncope.  The most recent one was while driving within the past few months.  She did not have any arm or leg weakness or numbness.  She did not have any speech or swallowing difficulty.  No monocular blindness.  A carotid duplex was performed which suggested 50 to 69% right ICA stenosis and less than 50% left ICA stenosis.  By our grading criteria this would fall in the 40 to 59% range on the right in the 1 to 39% range on the left..     Past Medical History:  Diagnosis Date   Allergic rhinitis due to allergen    Cervicalgia    Esophageal spasm    Fibrocystic breast disease    Hiatal hernia    Hypercalcemia    Hyperparathyroidism (HCC)    Kidney stone    Lung cancer (HCC) 02/14/2013   Partial RML Lung Resection.   Mitral valvular prolapse    Osteoarthritis    Palpitations    Rosacea    Senile osteoporosis    Vitamin D deficiency     Past Surgical History:  Procedure Laterality Date   ABDOMINAL HYSTERECTOMY     CHOLECYSTECTOMY     OOPHORECTOMY     PARATHYROIDECTOMY  07/09/2015   THORACOSCOPY WITH WEDGE RESECTION LUNG       Family History  Problem Relation Age of Onset   Cancer Mother    Nephrolithiasis Mother    Heart disease Father    Pancreatic cancer Brother    Dementia Maternal Grandfather    Heart attack Paternal Grandfather       Social History   Tobacco Use   Smoking status: Never   Smokeless tobacco: Never  Vaping Use   Vaping status: Never Used  Substance Use Topics   Alcohol use: No    Alcohol/week: 0.0 standard drinks of alcohol    Drug use: No     Allergies  Allergen Reactions   Chloraprep One Step [Chlorhexidine Gluconate] Rash   Erythromycin Rash   Sulfa Antibiotics Rash    Current Outpatient Medications  Medication Sig Dispense Refill   calcium carbonate (TUMS EX) 750 MG chewable tablet Chew by mouth.     Cholecalciferol (VITAMIN D) 2000 units tablet Take 2,000 Units by mouth daily.      cyanocobalamin 1000 MCG tablet Take by mouth.     escitalopram (LEXAPRO) 10 MG tablet Take 10 mg by mouth daily.     losartan (COZAAR) 50 MG tablet Take 50 mg by mouth daily.     methimazole (TAPAZOLE) 5 MG tablet Take 2.5 mg by mouth daily.     No current facility-administered medications for this visit.      REVIEW OF SYSTEMS (Negative unless checked)  Constitutional: [] Weight loss  [] Fever  [] Chills Cardiac: [] Chest pain   [] Chest pressure   [] Palpitations   [] Shortness of breath when laying flat   [] Shortness of breath at rest   [x] Shortness of breath with exertion. Vascular:  [] Pain in legs with walking   [] Pain  in legs at rest   [] Pain in legs when laying flat   [] Claudication   [] Pain in feet when walking  [] Pain in feet at rest  [] Pain in feet when laying flat   [] History of DVT   [] Phlebitis   [] Swelling in legs   [] Varicose veins   [] Non-healing ulcers Pulmonary:   [] Uses home oxygen   [] Productive cough   [] Hemoptysis   [] Wheeze  [] COPD   [] Asthma Neurologic:  [x] Dizziness  [x] Blackouts   [] Seizures   [] History of stroke   [] History of TIA  [] Aphasia   [] Temporary blindness   [] Dysphagia   [] Weakness or numbness in arms   [] Weakness or numbness in legs Musculoskeletal:  [x] Arthritis   [] Joint swelling   [] Joint pain   [] Low back pain Hematologic:  [] Easy bruising  [] Easy bleeding   [] Hypercoagulable state   [] Anemic  [] Hepatitis Gastrointestinal:  [] Blood in stool   [] Vomiting blood  [] Gastroesophageal reflux/heartburn   [] Abdominal pain Genitourinary:  [] Chronic kidney disease   [] Difficult urination   [] Frequent urination  [] Burning with urination   [] Hematuria Skin:  [] Rashes   [] Ulcers   [] Wounds Psychological:  [] History of anxiety   []  History of major depression.    Physical Exam BP 108/69 Comment: left arm  Pulse 78   Resp 18   Ht 5' 8 (1.727 m)   Wt 145 lb 12.8 oz (66.1 kg)   BMI 22.17 kg/m  Gen:  WD/WN, NAD. Appears younger than stated age. Head: [place]/AT, No temporalis wasting.  Ear/Nose/Throat: Hearing grossly intact, nares w/o erythema or drainage, oropharynx w/o Erythema/Exudate Eyes: Conjunctiva clear, sclera non-icteric  Neck: trachea midline.  No JVD.  Pulmonary:  Good air movement, respirations not labored, no use of accessory muscles  Cardiac: RRR, no JVD Vascular:  Vessel Right Left  Radial Palpable Palpable                                   Gastrointestinal:. No masses, surgical incisions, or scars. Musculoskeletal: M/S 5/5 throughout.  Extremities without ischemic changes.  No deformity or atrophy. No edema. Neurologic: Sensation grossly intact in extremities.  Symmetrical.  Speech is fluent. Motor exam as listed above. Psychiatric: Judgment intact, Mood & affect appropriate for pt's clinical situation. Dermatologic: No rashes or ulcers noted.  No cellulitis or open wounds.    Radiology No results found.  Labs No results found for this or any previous visit (from the past 2160 hours).  Assessment/Plan:  Carotid stenosis A carotid duplex was performed which suggested 50 to 69% right ICA stenosis and less than 50% left ICA stenosis.  By our grading criteria this would fall in the 40 to 59% range on the right in the 1 to 39% range on the left.  At this level of stenosis, it is unlikely to be the cause of her presyncopal episodes.  It also does not put her at an increased risk of stroke over the general population.  I would recommend no intervention on this at this time.  Will plan an ultrasound in 3 to 6 months for follow-up.  Essential  hypertension blood pressure control important in reducing the progression of atherosclerotic disease. On appropriate oral medications.      Selinda Gu 12/13/2023, 3:50 PM   This note was created with Dragon medical transcription system.  Any errors from dictation are unintentional.

## 2024-04-11 ENCOUNTER — Other Ambulatory Visit (INDEPENDENT_AMBULATORY_CARE_PROVIDER_SITE_OTHER): Payer: Self-pay | Admitting: Vascular Surgery

## 2024-04-11 DIAGNOSIS — M7989 Other specified soft tissue disorders: Secondary | ICD-10-CM

## 2024-04-17 ENCOUNTER — Ambulatory Visit (INDEPENDENT_AMBULATORY_CARE_PROVIDER_SITE_OTHER)

## 2024-04-17 ENCOUNTER — Ambulatory Visit (INDEPENDENT_AMBULATORY_CARE_PROVIDER_SITE_OTHER): Admitting: Vascular Surgery

## 2024-04-17 ENCOUNTER — Encounter (INDEPENDENT_AMBULATORY_CARE_PROVIDER_SITE_OTHER): Payer: Self-pay | Admitting: Vascular Surgery

## 2024-04-17 VITALS — BP 129/73 | HR 73 | Resp 18 | Ht 68.0 in | Wt 145.6 lb

## 2024-04-17 DIAGNOSIS — I10 Essential (primary) hypertension: Secondary | ICD-10-CM | POA: Diagnosis not present

## 2024-04-17 DIAGNOSIS — I6523 Occlusion and stenosis of bilateral carotid arteries: Secondary | ICD-10-CM | POA: Diagnosis not present

## 2024-04-17 DIAGNOSIS — M7989 Other specified soft tissue disorders: Secondary | ICD-10-CM

## 2024-04-17 NOTE — Assessment & Plan Note (Signed)
 Right leg.  Currently better without recurrent symptoms.  Venous study today was unrevealing with no evidence of DVT, superficial thrombophlebitis, or venous reflux identified in the right lower extremity.  Compression and elevation for symptomatic relief as needed.

## 2024-04-17 NOTE — Progress Notes (Signed)
 MRN : [id number]  Virginia Silva is a 88 y.o. (1935-06-29) female who presents with chief complaint of  Chief Complaint  Patient presents with   Follow-up    3-4 month follow up + carotid + R reflux  .  History of Present Illness: Patient returns today in follow up of multiple vascular issues  Discussed the use of AI scribe software for clinical note transcription with the patient, who gave verbal consent to proceed.  History of Present Illness Virginia Silva is an 88 year old female who presents for a follow-up visit regarding her vascular health.  She reports no new symptoms since her last visit. She previously had transient visual disturbances with graying out, tunnel vision, and darkness without complete loss of consciousness, and she has had no recent syncope.  Today's carotid imaging showed very mild bilateral carotid disease.  Her legs have been doing well without worsening swelling or pain.  Her venous study today of the right lower extremity was normal with no thrombosis or reflux identified    Results RADIOLOGY Carotid ultrasound: Mild bilateral stenosis, minimal disease Lower extremity venous ultrasound (04/17/2024): No deep vein thrombosis, no phlebitis, no venous insufficiency/reflux in the right lower extremity  Current Outpatient Medications  Medication Sig Dispense Refill   calcium carbonate (TUMS EX) 750 MG chewable tablet Chew by mouth.     Cholecalciferol (VITAMIN D) 2000 units tablet Take 2,000 Units by mouth daily.      cyanocobalamin 1000 MCG tablet Take by mouth.     escitalopram (LEXAPRO) 10 MG tablet Take 10 mg by mouth daily.     losartan (COZAAR) 50 MG tablet Take 50 mg by mouth daily.     methimazole (TAPAZOLE) 5 MG tablet Take 2.5 mg by mouth daily.     No current facility-administered medications for this visit.    Past Medical History:  Diagnosis Date   Allergic rhinitis due to allergen    Cervicalgia    Esophageal spasm    Fibrocystic  breast disease    Hiatal hernia    Hypercalcemia    Hyperparathyroidism    Kidney stone    Lung cancer (HCC) 02/14/2013   Partial RML Lung Resection.   Mitral valvular prolapse    Osteoarthritis    Palpitations    Rosacea    Senile osteoporosis    Vitamin D deficiency     Past Surgical History:  Procedure Laterality Date   ABDOMINAL HYSTERECTOMY     CHOLECYSTECTOMY     OOPHORECTOMY     PARATHYROIDECTOMY  07/09/2015   THORACOSCOPY WITH WEDGE RESECTION LUNG       Social History   Tobacco Use   Smoking status: Never   Smokeless tobacco: Never  Vaping Use   Vaping status: Never Used  Substance Use Topics   Alcohol use: No    Alcohol/week: 0.0 standard drinks of alcohol   Drug use: No      Family History  Problem Relation Age of Onset   Cancer Mother    Nephrolithiasis Mother    Heart disease Father    Pancreatic cancer Brother    Dementia Maternal Grandfather    Heart attack Paternal Grandfather      Allergies  Allergen Reactions   Chloraprep One Step [Chlorhexidine Gluconate] Rash   Erythromycin Rash   Sulfa Antibiotics Rash     REVIEW OF SYSTEMS (Negative unless checked)   Constitutional: [] Weight loss  [] Fever  [] Chills Cardiac: [] Chest pain   [] Chest pressure   []   Palpitations   [] Shortness of breath when laying flat   [] Shortness of breath at rest   [x] Shortness of breath with exertion. Vascular:  [] Pain in legs with walking   [] Pain in legs at rest   [] Pain in legs when laying flat   [] Claudication   [] Pain in feet when walking  [] Pain in feet at rest  [] Pain in feet when laying flat   [] History of DVT   [] Phlebitis   [] Swelling in legs   [] Varicose veins   [] Non-healing ulcers Pulmonary:   [] Uses home oxygen   [] Productive cough   [] Hemoptysis   [] Wheeze  [] COPD   [] Asthma Neurologic:  [x] Dizziness  [x] Blackouts   [] Seizures   [] History of stroke   [] History of TIA  [] Aphasia   [] Temporary blindness   [] Dysphagia   [] Weakness or numbness in arms    [] Weakness or numbness in legs Musculoskeletal:  [x] Arthritis   [] Joint swelling   [] Joint pain   [] Low back pain Hematologic:  [] Easy bruising  [] Easy bleeding   [] Hypercoagulable state   [] Anemic  [] Hepatitis Gastrointestinal:  [] Blood in stool   [] Vomiting blood  [] Gastroesophageal reflux/heartburn   [] Abdominal pain Genitourinary:  [] Chronic kidney disease   [] Difficult urination  [] Frequent urination  [] Burning with urination   [] Hematuria Skin:  [] Rashes   [] Ulcers   [] Wounds Psychological:  [] History of anxiety   []  History of major depression.   Physical Examination  BP 129/73 (BP Location: Left Arm, Patient Position: Sitting, Cuff Size: Normal)   Pulse 73   Resp 18   Ht 5' 8 (1.727 m)   Wt 145 lb 9.6 oz (66 kg)   BMI 22.14 kg/m  Gen:  WD/WN, NAD.  Appears much younger than stated age Head: [place]/AT, No temporalis wasting. Ear/Nose/Throat: Hearing grossly intact, nares w/o erythema or drainage Eyes: Conjunctiva clear. Sclera non-icteric Neck: Supple.  Trachea midline Pulmonary:  Good air movement, no use of accessory muscles.  Cardiac: RRR, no JVD Vascular:  Vessel Right Left  Radial Palpable Palpable                   Musculoskeletal: M/S 5/5 throughout.  No deformity or atrophy.  No appreciable edema. Neurologic: Sensation grossly intact in extremities.  Symmetrical.  Speech is fluent.  Psychiatric: Judgment intact, Mood & affect appropriate for pt's clinical situation. Dermatologic: No rashes or ulcers noted.  No cellulitis or open wounds.  Physical Exam CARDIOVASCULAR: Legs without blood clots, phlebitis, or leaky veins. Carotid arteries with minimal disease.    Labs No results found for this or any previous visit (from the past 2160 hours).  Radiology No results found.  Assessment/Plan  Assessment & Plan Bilateral carotid artery stenosis Mild bilateral carotid stenosis with minimal disease. No new symptoms. No vascular cause for previous visual  disturbances identified. - Continue annual carotid evaluation.  Essential hypertension blood pressure control important in reducing the progression of atherosclerotic disease. On appropriate oral medications.  Swelling of limb Right leg.  Currently better without recurrent symptoms.  Venous study today was unrevealing with no evidence of DVT, superficial thrombophlebitis, or venous reflux identified in the right lower extremity.  Compression and elevation for symptomatic relief as needed.   Selinda Gu, MD  04/17/2024 2:15 PM    This note was created with Dragon medical transcription system.  Any errors from dictation are purely unintentional

## 2024-07-03 ENCOUNTER — Ambulatory Visit: Payer: Medicare Other | Admitting: Urology

## 2025-04-23 ENCOUNTER — Encounter (INDEPENDENT_AMBULATORY_CARE_PROVIDER_SITE_OTHER)

## 2025-04-23 ENCOUNTER — Ambulatory Visit (INDEPENDENT_AMBULATORY_CARE_PROVIDER_SITE_OTHER): Admitting: Vascular Surgery
